# Patient Record
Sex: Female | Born: 1955 | Race: Black or African American | Hispanic: No | Marital: Married | Smoking: Former smoker
Health system: Southern US, Community
[De-identification: ages and names within clinical notes are randomized; demographics above are authoritative.]

## PROBLEM LIST (undated history)

## (undated) DIAGNOSIS — I639 Cerebral infarction, unspecified: Secondary | ICD-10-CM

## (undated) DIAGNOSIS — Z720 Tobacco use: Secondary | ICD-10-CM

## (undated) HISTORY — PX: APPENDECTOMY: SHX54

## (undated) HISTORY — PX: HYSTERECTOMY ABDOMINAL WITH SALPINGO-OOPHORECTOMY: SHX6792

---

## 2020-09-25 ENCOUNTER — Inpatient Hospital Stay (HOSPITAL_COMMUNITY)
Admission: EM | Admit: 2020-09-25 | Discharge: 2020-10-02 | DRG: 280 | Discharge status: Against Medical Advice | Attending: Cardiology | Admitting: Cardiology

## 2020-09-25 ENCOUNTER — Inpatient Hospital Stay (HOSPITAL_COMMUNITY)
Admission: EM | Discharge status: Against Medical Advice | Payer: Self-pay | Source: Home / Self Care | Attending: Cardiovascular Disease

## 2020-09-25 ENCOUNTER — Encounter (HOSPITAL_COMMUNITY): Payer: Self-pay | Admitting: Cardiology

## 2020-09-25 ENCOUNTER — Inpatient Hospital Stay (HOSPITAL_COMMUNITY)

## 2020-09-25 DIAGNOSIS — Z90710 Acquired absence of both cervix and uterus: Secondary | ICD-10-CM

## 2020-09-25 DIAGNOSIS — F32A Depression, unspecified: Secondary | ICD-10-CM | POA: Diagnosis present

## 2020-09-25 DIAGNOSIS — E669 Obesity, unspecified: Secondary | ICD-10-CM | POA: Diagnosis present

## 2020-09-25 DIAGNOSIS — I6330 Cerebral infarction due to thrombosis of unspecified cerebral artery: Secondary | ICD-10-CM | POA: Diagnosis not present

## 2020-09-25 DIAGNOSIS — I10 Essential (primary) hypertension: Secondary | ICD-10-CM | POA: Diagnosis present

## 2020-09-25 DIAGNOSIS — E1165 Type 2 diabetes mellitus with hyperglycemia: Secondary | ICD-10-CM | POA: Diagnosis not present

## 2020-09-25 DIAGNOSIS — Z8249 Family history of ischemic heart disease and other diseases of the circulatory system: Secondary | ICD-10-CM

## 2020-09-25 DIAGNOSIS — I6340 Cerebral infarction due to embolism of unspecified cerebral artery: Secondary | ICD-10-CM | POA: Diagnosis not present

## 2020-09-25 DIAGNOSIS — I25110 Atherosclerotic heart disease of native coronary artery with unstable angina pectoris: Secondary | ICD-10-CM | POA: Diagnosis present

## 2020-09-25 DIAGNOSIS — Z66 Do not resuscitate: Secondary | ICD-10-CM | POA: Diagnosis not present

## 2020-09-25 DIAGNOSIS — E0800 Diabetes mellitus due to underlying condition with hyperosmolarity without nonketotic hyperglycemic-hyperosmolar coma (NKHHC): Secondary | ICD-10-CM | POA: Diagnosis not present

## 2020-09-25 DIAGNOSIS — R0600 Dyspnea, unspecified: Secondary | ICD-10-CM | POA: Diagnosis present

## 2020-09-25 DIAGNOSIS — Z87891 Personal history of nicotine dependence: Secondary | ICD-10-CM

## 2020-09-25 DIAGNOSIS — Z7982 Long term (current) use of aspirin: Secondary | ICD-10-CM

## 2020-09-25 DIAGNOSIS — I213 ST elevation (STEMI) myocardial infarction of unspecified site: Secondary | ICD-10-CM | POA: Diagnosis present

## 2020-09-25 DIAGNOSIS — Z7189 Other specified counseling: Secondary | ICD-10-CM

## 2020-09-25 DIAGNOSIS — I2119 ST elevation (STEMI) myocardial infarction involving other coronary artery of inferior wall: Principal | ICD-10-CM | POA: Diagnosis present

## 2020-09-25 DIAGNOSIS — Z9114 Patient's other noncompliance with medication regimen: Secondary | ICD-10-CM

## 2020-09-25 DIAGNOSIS — R079 Chest pain, unspecified: Secondary | ICD-10-CM

## 2020-09-25 DIAGNOSIS — F419 Anxiety disorder, unspecified: Secondary | ICD-10-CM | POA: Diagnosis present

## 2020-09-25 DIAGNOSIS — I2510 Atherosclerotic heart disease of native coronary artery without angina pectoris: Secondary | ICD-10-CM | POA: Diagnosis not present

## 2020-09-25 DIAGNOSIS — Z20822 Contact with and (suspected) exposure to covid-19: Secondary | ICD-10-CM | POA: Diagnosis present

## 2020-09-25 DIAGNOSIS — I2111 ST elevation (STEMI) myocardial infarction involving right coronary artery: Secondary | ICD-10-CM | POA: Diagnosis not present

## 2020-09-25 DIAGNOSIS — E1100 Type 2 diabetes mellitus with hyperosmolarity without nonketotic hyperglycemic-hyperosmolar coma (NKHHC): Secondary | ICD-10-CM | POA: Diagnosis present

## 2020-09-25 DIAGNOSIS — E785 Hyperlipidemia, unspecified: Secondary | ICD-10-CM | POA: Diagnosis present

## 2020-09-25 DIAGNOSIS — E7800 Pure hypercholesterolemia, unspecified: Secondary | ICD-10-CM | POA: Diagnosis not present

## 2020-09-25 DIAGNOSIS — I255 Ischemic cardiomyopathy: Secondary | ICD-10-CM | POA: Diagnosis present

## 2020-09-25 DIAGNOSIS — R413 Other amnesia: Secondary | ICD-10-CM | POA: Diagnosis present

## 2020-09-25 DIAGNOSIS — Z79899 Other long term (current) drug therapy: Secondary | ICD-10-CM

## 2020-09-25 DIAGNOSIS — I5023 Acute on chronic systolic (congestive) heart failure: Secondary | ICD-10-CM | POA: Diagnosis not present

## 2020-09-25 DIAGNOSIS — I5022 Chronic systolic (congestive) heart failure: Secondary | ICD-10-CM | POA: Diagnosis not present

## 2020-09-25 DIAGNOSIS — F17210 Nicotine dependence, cigarettes, uncomplicated: Secondary | ICD-10-CM | POA: Diagnosis not present

## 2020-09-25 DIAGNOSIS — I361 Nonrheumatic tricuspid (valve) insufficiency: Secondary | ICD-10-CM | POA: Diagnosis not present

## 2020-09-25 DIAGNOSIS — E119 Type 2 diabetes mellitus without complications: Secondary | ICD-10-CM | POA: Diagnosis not present

## 2020-09-25 DIAGNOSIS — I639 Cerebral infarction, unspecified: Secondary | ICD-10-CM | POA: Diagnosis not present

## 2020-09-25 HISTORY — DX: Tobacco use: Z72.0

## 2020-09-25 HISTORY — DX: ST elevation (STEMI) myocardial infarction of unspecified site: I21.3

## 2020-09-25 HISTORY — PX: LEFT HEART CATH AND CORONARY ANGIOGRAPHY: CATH118249

## 2020-09-25 HISTORY — PX: CORONARY STENT INTERVENTION: CATH118234

## 2020-09-25 LAB — COMPREHENSIVE METABOLIC PANEL
ALT: 15 U/L (ref 0–44)
AST: 25 U/L (ref 15–41)
Albumin: 3.7 g/dL (ref 3.5–5.0)
Alkaline Phosphatase: 68 U/L (ref 38–126)
Anion gap: 13 (ref 5–15)
BUN: 9 mg/dL (ref 8–23)
CO2: 24 mmol/L (ref 22–32)
Calcium: 9.7 mg/dL (ref 8.9–10.3)
Chloride: 96 mmol/L — ABNORMAL LOW (ref 98–111)
Creatinine, Ser: 0.85 mg/dL (ref 0.44–1.00)
GFR, Estimated: >60 mL/min (ref >60)
Glucose, Bld: 477 mg/dL — ABNORMAL HIGH (ref 70–99)
Potassium: 3.3 mmol/L — ABNORMAL LOW (ref 3.5–5.1)
Sodium: 133 mmol/L — ABNORMAL LOW (ref 135–145)
Total Bilirubin: 0.7 mg/dL (ref 0.3–1.2)
Total Protein: 7.8 g/dL (ref 6.5–8.1)

## 2020-09-25 LAB — CBC WITH DIFFERENTIAL/PLATELET
Abs Immature Granulocytes: 0.02 10*3/uL (ref 0.00–0.07)
Basophils Absolute: 0 10*3/uL (ref 0.0–0.1)
Basophils Relative: 1 %
Eosinophils Absolute: 0.1 10*3/uL (ref 0.0–0.5)
Eosinophils Relative: 1 %
HCT: 42.2 % (ref 36.0–46.0)
Hemoglobin: 14 g/dL (ref 12.0–15.0)
Immature Granulocytes: 0 %
Lymphocytes Relative: 34 %
Lymphs Abs: 2.5 10*3/uL (ref 0.7–4.0)
MCH: 29.4 pg (ref 26.0–34.0)
MCHC: 33.2 g/dL (ref 30.0–36.0)
MCV: 88.7 fL (ref 80.0–100.0)
Monocytes Absolute: 0.6 10*3/uL (ref 0.1–1.0)
Monocytes Relative: 9 %
Neutro Abs: 4 10*3/uL (ref 1.7–7.7)
Neutrophils Relative %: 55 %
Platelets: 284 10*3/uL (ref 150–400)
RBC: 4.76 MIL/uL (ref 3.87–5.11)
RDW: 12.1 % (ref 11.5–15.5)
WBC: 7.2 10*3/uL (ref 4.0–10.5)
nRBC: 0 % (ref 0.0–0.2)

## 2020-09-25 LAB — TROPONIN I (HIGH SENSITIVITY)
Troponin I (High Sensitivity): 9554 ng/L (ref <18)
Troponin I (High Sensitivity): 11565 ng/L (ref <18)

## 2020-09-25 LAB — MAGNESIUM: Magnesium: 2.1 mg/dL (ref 1.7–2.4)

## 2020-09-25 LAB — HIV ANTIBODY (ROUTINE TESTING W REFLEX): HIV Screen 4th Generation wRfx: NONREACTIVE

## 2020-09-25 LAB — LIPID PANEL
Cholesterol: 215 mg/dL — ABNORMAL HIGH (ref 0–200)
HDL: 33 mg/dL — ABNORMAL LOW (ref >40)
LDL Cholesterol: 155 mg/dL — ABNORMAL HIGH (ref 0–99)
Total CHOL/HDL Ratio: 6.5 RATIO
Triglycerides: 133 mg/dL (ref <150)
VLDL: 27 mg/dL (ref 0–40)

## 2020-09-25 LAB — CBC
HCT: 41.7 % (ref 36.0–46.0)
Hemoglobin: 14 g/dL (ref 12.0–15.0)
MCH: 29.7 pg (ref 26.0–34.0)
MCHC: 33.6 g/dL (ref 30.0–36.0)
MCV: 88.5 fL (ref 80.0–100.0)
Platelets: 280 10*3/uL (ref 150–400)
RBC: 4.71 MIL/uL (ref 3.87–5.11)
RDW: 12.1 % (ref 11.5–15.5)
WBC: 7.2 10*3/uL (ref 4.0–10.5)
nRBC: 0 % (ref 0.0–0.2)

## 2020-09-25 LAB — PROTIME-INR
INR: 1.1 (ref 0.8–1.2)
Prothrombin Time: 14.1 seconds (ref 11.4–15.2)

## 2020-09-25 LAB — MRSA PCR SCREENING: MRSA by PCR: NEGATIVE

## 2020-09-25 LAB — POCT ACTIVATED CLOTTING TIME: Activated Clotting Time: 358 seconds

## 2020-09-25 LAB — RESP PANEL BY RT-PCR (FLU A&B, COVID) ARPGX2
Influenza A by PCR: NEGATIVE
Influenza B by PCR: NEGATIVE
SARS Coronavirus 2 by RT PCR: NEGATIVE

## 2020-09-25 LAB — T4, FREE: Free T4: 1.13 ng/dL — ABNORMAL HIGH (ref 0.61–1.12)

## 2020-09-25 SURGERY — LEFT HEART CATH AND CORONARY ANGIOGRAPHY
Anesthesia: LOCAL

## 2020-09-25 MED ORDER — METOPROLOL TARTRATE 25 MG PO TABS
25.0000 mg | ORAL_TABLET | Freq: Two times a day (BID) | ORAL | Status: DC
  Administered 2020-09-25: 25 mg via ORAL

## 2020-09-25 MED ORDER — VERAPAMIL HCL 2.5 MG/ML IV SOLN
INTRAVENOUS | Status: AC
  Filled 2020-09-25: qty 2

## 2020-09-25 MED ORDER — INSULIN ASPART 100 UNIT/ML IJ SOLN: 10.0000 [IU] | Freq: Once | INTRAMUSCULAR | Status: DC

## 2020-09-25 MED ORDER — MIDAZOLAM HCL 2 MG/2ML IJ SOLN
INTRAMUSCULAR | Status: AC
  Filled 2020-09-25: qty 2

## 2020-09-25 MED ORDER — MIDAZOLAM HCL 2 MG/2ML IJ SOLN
INTRAMUSCULAR | Status: DC | PRN
  Administered 2020-09-25: 2 mg via INTRAVENOUS

## 2020-09-25 MED ORDER — ONDANSETRON HCL 4 MG/2ML IJ SOLN: 4.0000 mg | Freq: Four times a day (QID) | INTRAMUSCULAR | Status: DC | PRN

## 2020-09-25 MED ORDER — HEPARIN (PORCINE) IN NACL 1000-0.9 UT/500ML-% IV SOLN
INTRAVENOUS | Status: DC | PRN
  Administered 2020-09-25 (×3): 500 mL

## 2020-09-25 MED ORDER — SODIUM CHLORIDE 0.9 % IV SOLN
250.0000 mL | INTRAVENOUS | Status: DC | PRN
Start: 2020-09-25 — End: 2020-10-02

## 2020-09-25 MED ORDER — TICAGRELOR 90 MG PO TABS
90.0000 mg | ORAL_TABLET | Freq: Two times a day (BID) | ORAL | Status: DC
  Administered 2020-09-26 – 2020-09-27 (×4): 90 mg via ORAL
  Filled 2020-09-25 (×4): qty 1

## 2020-09-25 MED ORDER — HEPARIN SODIUM (PORCINE) 1000 UNIT/ML IJ SOLN
INTRAMUSCULAR | Status: DC | PRN
  Administered 2020-09-25: 12000 [IU] via INTRAVENOUS

## 2020-09-25 MED ORDER — HYDRALAZINE HCL 20 MG/ML IJ SOLN: 10.0000 mg | INTRAMUSCULAR | Status: DC | PRN

## 2020-09-25 MED ORDER — FENTANYL CITRATE (PF) 100 MCG/2ML IJ SOLN
INTRAMUSCULAR | Status: AC
  Filled 2020-09-25: qty 2

## 2020-09-25 MED ORDER — ASPIRIN 81 MG PO CHEW
324.0000 mg | CHEWABLE_TABLET | ORAL | Status: AC
  Administered 2020-09-26: 324 mg via ORAL
  Filled 2020-09-25: qty 4

## 2020-09-25 MED ORDER — LABETALOL HCL 5 MG/ML IV SOLN: 10.0000 mg | INTRAVENOUS | Status: AC | PRN

## 2020-09-25 MED ORDER — INSULIN GLARGINE 100 UNIT/ML ~~LOC~~ SOLN
15.0000 [IU] | Freq: Every day | SUBCUTANEOUS | Status: DC
Start: 2020-09-25 — End: 2020-09-25
  Filled 2020-09-25: qty 0.15

## 2020-09-25 MED ORDER — ATORVASTATIN CALCIUM 80 MG PO TABS
80.0000 mg | ORAL_TABLET | Freq: Every day | ORAL | Status: DC
Start: 2020-09-25 — End: 2020-10-02
  Administered 2020-09-25 – 2020-10-02 (×8): 80 mg via ORAL
  Filled 2020-09-25 (×8): qty 1

## 2020-09-25 MED ORDER — MORPHINE SULFATE (PF) 2 MG/ML IV SOLN
2.0000 mg | INTRAVENOUS | Status: DC | PRN
  Administered 2020-09-26 – 2020-09-30 (×20): 2 mg via INTRAVENOUS
  Filled 2020-09-25 (×21): qty 1

## 2020-09-25 MED ORDER — ACETAMINOPHEN 325 MG PO TABS
650.0000 mg | ORAL_TABLET | ORAL | Status: DC | PRN
  Administered 2020-09-25 – 2020-09-26 (×2): 650 mg via ORAL
  Filled 2020-09-25 (×2): qty 2

## 2020-09-25 MED ORDER — INSULIN ASPART 100 UNIT/ML IJ SOLN: 0.0000 [IU] | Freq: Every day | INTRAMUSCULAR | Status: DC

## 2020-09-25 MED ORDER — HEPARIN SODIUM (PORCINE) 1000 UNIT/ML IJ SOLN
INTRAMUSCULAR | Status: AC
  Filled 2020-09-25: qty 2

## 2020-09-25 MED ORDER — ALPRAZOLAM 0.25 MG PO TABS: 0.2500 mg | ORAL_TABLET | Freq: Two times a day (BID) | ORAL | Status: DC | PRN

## 2020-09-25 MED ORDER — OXYCODONE HCL 5 MG PO TABS
5.0000 mg | ORAL_TABLET | ORAL | Status: DC | PRN
  Administered 2020-09-29 – 2020-10-01 (×3): 10 mg via ORAL
  Filled 2020-09-25 (×3): qty 2

## 2020-09-25 MED ORDER — POTASSIUM CHLORIDE CRYS ER 20 MEQ PO TBCR
40.0000 meq | EXTENDED_RELEASE_TABLET | Freq: Two times a day (BID) | ORAL | Status: DC
  Administered 2020-09-25: 40 meq via ORAL

## 2020-09-25 MED ORDER — TIROFIBAN HCL IN NACL 5-0.9 MG/100ML-% IV SOLN
INTRAVENOUS | Status: DC | PRN
  Administered 2020-09-25: 0.15 ug/kg/min via INTRAVENOUS

## 2020-09-25 MED ORDER — SODIUM CHLORIDE 0.9 % IV SOLN: INTRAVENOUS | Status: AC

## 2020-09-25 MED ORDER — TIROFIBAN (AGGRASTAT) BOLUS VIA INFUSION
INTRAVENOUS | Status: DC | PRN
  Administered 2020-09-25: 2552.5 ug via INTRAVENOUS

## 2020-09-25 MED ORDER — HEPARIN (PORCINE) IN NACL 1000-0.9 UT/500ML-% IV SOLN
INTRAVENOUS | Status: AC
  Filled 2020-09-25: qty 500

## 2020-09-25 MED ORDER — SODIUM CHLORIDE 0.9% FLUSH
3.0000 mL | Freq: Two times a day (BID) | INTRAVENOUS | Status: DC
  Administered 2020-09-25 – 2020-10-02 (×14): 3 mL via INTRAVENOUS

## 2020-09-25 MED ORDER — LIDOCAINE HCL (PF) 1 % IJ SOLN
INTRAMUSCULAR | Status: AC
  Filled 2020-09-25: qty 30

## 2020-09-25 MED ORDER — SODIUM CHLORIDE 0.9% FLUSH: 3.0000 mL | INTRAVENOUS | Status: DC | PRN

## 2020-09-25 MED ORDER — POTASSIUM CHLORIDE CRYS ER 20 MEQ PO TBCR
40.0000 meq | EXTENDED_RELEASE_TABLET | Freq: Once | ORAL | Status: DC
  Filled 2020-09-25: qty 2

## 2020-09-25 MED ORDER — TICAGRELOR 90 MG PO TABS
ORAL_TABLET | ORAL | Status: AC
  Filled 2020-09-25: qty 2

## 2020-09-25 MED ORDER — ACETAMINOPHEN 325 MG PO TABS: 650.0000 mg | ORAL_TABLET | ORAL | Status: DC | PRN

## 2020-09-25 MED ORDER — SODIUM CHLORIDE 0.9 % IV SOLN: INTRAVENOUS | Status: DC

## 2020-09-25 MED ORDER — ATORVASTATIN CALCIUM 80 MG PO TABS: 80.0000 mg | ORAL_TABLET | Freq: Every day | ORAL | Status: DC

## 2020-09-25 MED ORDER — VERAPAMIL HCL 2.5 MG/ML IV SOLN
INTRAVENOUS | Status: DC | PRN
  Administered 2020-09-25: 10 mL via INTRA_ARTERIAL

## 2020-09-25 MED ORDER — INSULIN ASPART 100 UNIT/ML IJ SOLN: 0.0000 [IU] | Freq: Three times a day (TID) | INTRAMUSCULAR | Status: DC

## 2020-09-25 MED ORDER — INSULIN ASPART 100 UNIT/ML IJ SOLN: 8.0000 [IU] | Freq: Once | INTRAMUSCULAR | Status: DC

## 2020-09-25 MED ORDER — NITROGLYCERIN IN D5W 200-5 MCG/ML-% IV SOLN
0.0000 ug/min | INTRAVENOUS | Status: DC
  Administered 2020-09-25: 5 ug/min via INTRAVENOUS
  Filled 2020-09-25: qty 250

## 2020-09-25 MED ORDER — NITROGLYCERIN 0.4 MG SL SUBL
0.4000 mg | SUBLINGUAL_TABLET | SUBLINGUAL | Status: DC | PRN
  Administered 2020-09-28 (×2): 0.4 mg via SUBLINGUAL
  Filled 2020-09-25: qty 1

## 2020-09-25 MED ORDER — FENTANYL CITRATE (PF) 100 MCG/2ML IJ SOLN
INTRAMUSCULAR | Status: DC | PRN
  Administered 2020-09-25: 50 ug via INTRAVENOUS

## 2020-09-25 MED ORDER — ASPIRIN EC 81 MG PO TBEC
81.0000 mg | DELAYED_RELEASE_TABLET | Freq: Every day | ORAL | Status: DC
  Administered 2020-09-27 – 2020-10-02 (×6): 81 mg via ORAL
  Filled 2020-09-25 (×7): qty 1

## 2020-09-25 MED ORDER — SODIUM CHLORIDE 0.9 % IV SOLN
INTRAVENOUS | Status: AC | PRN
  Administered 2020-09-25: 50 mL/h via INTRAVENOUS

## 2020-09-25 MED ORDER — NITROGLYCERIN 1 MG/10 ML FOR IR/CATH LAB
INTRA_ARTERIAL | Status: AC
  Filled 2020-09-25: qty 10

## 2020-09-25 MED ORDER — ASPIRIN 300 MG RE SUPP: 300.0000 mg | RECTAL | Status: AC

## 2020-09-25 MED ORDER — LIDOCAINE HCL (PF) 1 % IJ SOLN
INTRAMUSCULAR | Status: DC | PRN
  Administered 2020-09-25: 2 mL

## 2020-09-25 MED ORDER — TICAGRELOR 90 MG PO TABS
ORAL_TABLET | ORAL | Status: DC | PRN
  Administered 2020-09-25: 180 mg via ORAL

## 2020-09-25 MED ORDER — METOPROLOL TARTRATE 25 MG PO TABS
25.0000 mg | ORAL_TABLET | Freq: Two times a day (BID) | ORAL | Status: DC
  Administered 2020-09-26 (×2): 25 mg via ORAL
  Filled 2020-09-25 (×3): qty 1

## 2020-09-25 MED ORDER — TIROFIBAN HCL IN NACL 5-0.9 MG/100ML-% IV SOLN
INTRAVENOUS | Status: AC
  Filled 2020-09-25: qty 100

## 2020-09-25 MED ORDER — ASPIRIN 81 MG PO CHEW: 81.0000 mg | CHEWABLE_TABLET | Freq: Every day | ORAL | Status: DC

## 2020-09-25 MED ORDER — INSULIN ASPART 100 UNIT/ML IJ SOLN: 6.0000 [IU] | Freq: Three times a day (TID) | INTRAMUSCULAR | Status: DC

## 2020-09-25 SURGICAL SUPPLY — 14 items
CATH 5FR JL3.5 JR4 ANG PIG MP (CATHETERS) ×2 IMPLANT
CATH VISTA GUIDE 6FR JR4 (CATHETERS) ×2 IMPLANT
DEVICE RAD COMP TR BAND LRG (VASCULAR PRODUCTS) ×2 IMPLANT
GLIDESHEATH SLEND SS 6F .021 (SHEATH) ×2 IMPLANT
GUIDEWIRE INQWIRE 1.5J.035X260 (WIRE) ×1 IMPLANT
INQWIRE 1.5J .035X260CM (WIRE) ×2
KIT ENCORE 26 ADVANTAGE (KITS) ×2 IMPLANT
KIT HEART LEFT (KITS) ×2 IMPLANT
PACK CARDIAC CATHETERIZATION (CUSTOM PROCEDURE TRAY) ×2 IMPLANT
TRANSDUCER W/STOPCOCK (MISCELLANEOUS) ×2 IMPLANT
TUBING CIL FLEX 10 FLL-RA (TUBING) ×2 IMPLANT
WIRE ASAHI FIELDER XT 190CM (WIRE) ×2 IMPLANT
WIRE COUGAR XT STRL 190CM (WIRE) ×2 IMPLANT
WIRE HI TORQ WHISPER MS 190CM (WIRE) ×2 IMPLANT

## 2020-09-25 NOTE — H&P (Addendum)
Cardiology Admission History and Physical:   Patient ID: Sabrina Ellis MRN: [id number]; DOB: [birth year]   Admission date: 09/25/2020  PCP:  No primary care provider on file.   [facility] Providers Cardiologist:  None        Chief Complaint:  chest pain  Patient Profile:   Sabrina Ellis is a 65 y.o. female with no PMH though does not follow with MD who is being seen 09/25/2020 for the evaluation of STEMI.  History of Present Illness:   Ms. Ellis has had 2-3 months of episodic chest pain and told someone today because it seemed worse.  The friend called EMS and pt was being taken to [facility] hosp when ST became elevated in II, III, AVF.  [facility] was brought [facility] cardiac cath.  Remote tobacco hx stopped 5 years ago.      She does not follow with MD. No meds.  By EMS rec'd 324 mg of ASA and IV morphine for chest pain.  Currently here 4/10 pain.      Past Medical History:  Diagnosis Date   STEMI (ST elevation myocardial infarction) (HCC) 09/25/2020   Tobacco abuse stopped 5 yrs ago        Medications Prior to Admission: Prior to Admission medications   Not on File   none  Allergies:   No Known Allergies  Social History:   Social History   Socioeconomic History   Marital status: Not on file    Spouse name: Not on file   Number of children: Not on file   Years of education: Not on file   Highest education level: Not on file  Occupational History   Not on file  Tobacco Use   Smoking status: Former    Years: 45.00    Pack years: 0.00    Types: Cigarettes    Quit date: 04/2015    Years since quitting: 5.4   Smokeless tobacco: Never  Substance and Sexual Activity   Alcohol use: Not Currently   Drug use: Not Currently   Sexual activity: Yes  Other Topics Concern   Not on file  Social History Narrative   Not on file   Social Determinants of Health   Financial Resource Strain: Not on file  Food Insecurity: Not on file   Transportation Needs: Not on file  Physical Activity: Not on file  Stress: Not on file  Social Connections: Not on file  Intimate Partner Violence: Not on file    Family History:   The patient's family history includes Hypertension in her mother.    ROS:  Please see the history of present illness.  General:no colds or fevers, no weight changes Skin:no rashes or ulcers HEENT:no blurred vision, no congestion CV:see HPI PUL:see HPI GI:no diarrhea constipation or melena, no indigestion GU:no hematuria, no dysuria MS:no joint pain, no claudication Neuro:no syncope, no lightheadedness Endo:no diabetes, no thyroid disease  All other ROS reviewed and negative.     Physical Exam/Data:   Vitals:   09/25/20 1649  SpO2: 98%  Weight: 102.1 kg  Height: 5\' 8"  (1.727 m)   No intake or output data in the 24 hours ending 09/25/20 1707 Last 3 Weights 09/25/2020  Weight (lbs) 225 lb  Weight (kg) 102.059 kg     Body mass index is 34.21 kg/m.  General:  Well nourished, well developed, in no acute distress but very anxious HEENT: normal Lymph: no adenopathy Neck: no JVD Endocrine:  No thryomegaly Vascular: No carotid bruits;  pedal pulses 2+ bilaterally   Cardiac:  normal S1, S2; RRR; no murmur  Lungs:  clear to auscultation bilaterally, no wheezing, rhonchi or rales  Abd: soft, nontender, no hepatomegaly  Ext: no edema Musculoskeletal:  No deformities, BUE and BLE strength normal and equal Skin: warm and dry  Neuro:  alert and oriented X 3 MAE follows commands, no focal abnormalities noted Psych:  Normal affect    EKG:  The ECG that was done today by EMS SR with ST elevation inf leads was personally reviewed   Relevant CV Studies: none  Laboratory Data:  High Sensitivity Troponin:  No results for input(s): TROPONINIHS in the last 720 hours.    ChemistryNo results for input(s): NA, K, CL, CO2, GLUCOSE, BUN, CREATININE, CALCIUM, GFRNONAA, GFRAA, ANIONGAP in the last 168 hours.   No results for input(s): PROT, ALBUMIN, AST, ALT, ALKPHOS, BILITOT in the last 168 hours. HematologyNo results for input(s): WBC, RBC, HGB, HCT, MCV, MCH, MCHC, RDW, PLT in the last 168 hours. BNPNo results for input(s): BNP, PROBNP in the last 168 hours.  DDimer No results for input(s): DDIMER in the last 168 hours.   Radiology/Studies:  No results found.   Assessment and Plan:   STEMI inf wall, emergent cath now.  Will follow BP and check A1C, lipids.  Add BB and statin along with ASA.     Risk Assessment/Risk Scores:    TIMI Risk Score for Unstable Angina or Non-ST Elevation MI:  STEMI  The patient's TIMI risk score is  , which indicates a  % risk of all cause mortality, new or recurrent myocardial infarction or need for urgent revascularization in the next 14 days.       Severity of Illness: The appropriate patient status for this patient is INPATIENT. Inpatient status is judged to be reasonable and necessary in order to provide the required intensity of service to ensure the patient's safety. The patient's presenting symptoms, physical exam findings, and initial radiographic and laboratory data in the context of their chronic comorbidities is felt to place them at high risk for further clinical deterioration. Furthermore, it is not anticipated that the patient will be medically stable for discharge from the hospital within 2 midnights of admission. The following factors support the patient status of inpatient.   " The patient's presenting symptoms include chest pain. " The worrisome physical exam findings include none. " The initial radiographic and laboratory data are worrisome because of no labs EKG with STEMI. " The chronic co-morbidities include obesity.   * I certify that at the point of admission it is my clinical judgment that the patient will require inpatient hospital care spanning beyond 2 midnights from the point of admission due to high intensity of service, high  risk for further deterioration and high frequency of surveillance required.*   For questions or updates, please contact [facility] Please consult [email or web address] for contact info under     Signed, Nada Boozer, NP  09/25/2020 5:07 PM   I have personally seen and examined this patient. I agree with the assessment and plan as outlined above.  65 yo female with no known PMH who is presenting with an inferior MI. Q waves on EKG suggest that her event occurred greater than 12 hours ago. Mild chest pain currently. Plan emergent cardiac cath. Further plans to follow after cath.   Verne Carrow 09/25/2020 5:51 PM

## 2020-09-25 NOTE — Significant Event (Signed)
Briefly, Ms. Kendrick-Comer presented today with a late-presenting inferior MI after her case worker called EMS due to complaints of dyspnea. She was found to have 3 vessel CAD, with culprit RCA occlusion, but unable to cross, likely due to late presentation. She was initiated on medical therapy with plans for CABG evaluation.  Her lab work recently resulted with a blood glucose of 477. She clarified that she was told at one point she had diabetes but was not on medical therapy. I ordered insulin therapy for her uncontrolled diabetes. I was then notified by her RN that she refused to take any insulin products. I proceeded to clarify this with her and had an extensive conversation regarding her reasoning for refusing insulin, the diagnosis of diabetes, the implications and complications of untreated diabetes, that refusing to treat her diabetes would likely not allow her to be candidate for CABG, that her diabetes was her primary risk factor for multivessel CAD, the well-established treatments for diabetes, and her understanding of all of the above. These apparently are long held and VERY strong beliefs, based in part on family members who died while taking insulin and some sort of personal religious belief that she should not take certain medications. Despite extensive conversation and exploration of her reasoning, she was very steadfast in her refusal of insulin or certain blood pressure medications that she cannot recall the name of (okay with metoprolol). Although some of her statements were contradictory and logical reasoning was lacking at times, she was able to demonstrate complete orientation and competence at making these decisions and was able to even admit that this was a personal belief that I would likely not understand and not in line with medical standard of care. She was able to express understanding of my medical recommendations and what I stated as the implications for refusing treatment, although  she expressed her own skepticism. She did believe we had her best interest in mind with our medical recommendations, but again stated she did not want any form of treatment for her diabetes. She also did not want any blood glucose checks. She has been trying to complete a DNR in the outpatient setting, and would like her code status changed with a portable DNR at discharge.   Will discontinue all insulin orders and blood glucose checks Code status updated to DNR

## 2020-09-26 ENCOUNTER — Inpatient Hospital Stay (HOSPITAL_COMMUNITY)

## 2020-09-26 ENCOUNTER — Other Ambulatory Visit: Payer: Self-pay

## 2020-09-26 DIAGNOSIS — E1165 Type 2 diabetes mellitus with hyperglycemia: Secondary | ICD-10-CM

## 2020-09-26 DIAGNOSIS — R079 Chest pain, unspecified: Secondary | ICD-10-CM

## 2020-09-26 DIAGNOSIS — I2119 ST elevation (STEMI) myocardial infarction involving other coronary artery of inferior wall: Principal | ICD-10-CM

## 2020-09-26 DIAGNOSIS — I361 Nonrheumatic tricuspid (valve) insufficiency: Secondary | ICD-10-CM

## 2020-09-26 LAB — CBC
HCT: 37.2 % (ref 36.0–46.0)
Hemoglobin: 12.4 g/dL (ref 12.0–15.0)
MCH: 29.7 pg (ref 26.0–34.0)
MCHC: 33.3 g/dL (ref 30.0–36.0)
MCV: 89 fL (ref 80.0–100.0)
Platelets: 267 10*3/uL (ref 150–400)
RBC: 4.18 MIL/uL (ref 3.87–5.11)
RDW: 12.4 % (ref 11.5–15.5)
WBC: 7.2 10*3/uL (ref 4.0–10.5)
nRBC: 0 % (ref 0.0–0.2)

## 2020-09-26 LAB — LIPID PANEL
Cholesterol: 186 mg/dL (ref 0–200)
HDL: 24 mg/dL — ABNORMAL LOW (ref >40)
LDL Cholesterol: 120 mg/dL — ABNORMAL HIGH (ref 0–99)
Total CHOL/HDL Ratio: 7.8 RATIO
Triglycerides: 209 mg/dL — ABNORMAL HIGH (ref <150)
VLDL: 42 mg/dL — ABNORMAL HIGH (ref 0–40)

## 2020-09-26 LAB — ECHOCARDIOGRAM COMPLETE
AR max vel: 2.85 cm2
AV Area VTI: 2.96 cm2
AV Area mean vel: 3.27 cm2
AV Mean grad: 3 mmHg
AV Peak grad: 5.5 mmHg
Ao pk vel: 1.17 m/s
Area-P 1/2: 5.23 cm2
Height: 68 in
S' Lateral: 3.1 cm
Weight: 3139.35 oz

## 2020-09-26 LAB — BASIC METABOLIC PANEL
Anion gap: 9 (ref 5–15)
BUN: 7 mg/dL — ABNORMAL LOW (ref 8–23)
CO2: 25 mmol/L (ref 22–32)
Calcium: 9 mg/dL (ref 8.9–10.3)
Chloride: 97 mmol/L — ABNORMAL LOW (ref 98–111)
Creatinine, Ser: 0.73 mg/dL (ref 0.44–1.00)
GFR, Estimated: >60 mL/min (ref >60)
Glucose, Bld: 433 mg/dL — ABNORMAL HIGH (ref 70–99)
Potassium: 4.1 mmol/L (ref 3.5–5.1)
Sodium: 131 mmol/L — ABNORMAL LOW (ref 135–145)

## 2020-09-26 LAB — POCT I-STAT, CHEM 8
BUN: 10 mg/dL (ref 8–23)
Calcium, Ion: 1.28 mmol/L (ref 1.15–1.40)
Chloride: 95 mmol/L — ABNORMAL LOW (ref 98–111)
Creatinine, Ser: 0.7 mg/dL (ref 0.44–1.00)
Glucose, Bld: 562 mg/dL (ref 70–99)
HCT: 43 % (ref 36.0–46.0)
Hemoglobin: 14.6 g/dL (ref 12.0–15.0)
Potassium: 3.8 mmol/L (ref 3.5–5.1)
Sodium: 133 mmol/L — ABNORMAL LOW (ref 135–145)
TCO2: 25 mmol/L (ref 22–32)

## 2020-09-26 LAB — TSH: TSH: 1.404 u[IU]/mL (ref 0.350–4.500)

## 2020-09-26 LAB — TROPONIN I (HIGH SENSITIVITY): Troponin I (High Sensitivity): 11491 ng/L (ref <18)

## 2020-09-26 MED ORDER — PERFLUTREN LIPID MICROSPHERE
1.0000 mL | INTRAVENOUS | Status: AC | PRN
  Administered 2020-09-26: 2 mL via INTRAVENOUS
  Filled 2020-09-26: qty 10

## 2020-09-26 MED ORDER — CHLORHEXIDINE GLUCONATE CLOTH 2 % EX PADS
6.0000 | MEDICATED_PAD | Freq: Every day | CUTANEOUS | Status: DC
  Administered 2020-09-26 – 2020-10-02 (×6): 6 via TOPICAL

## 2020-09-26 NOTE — Progress Notes (Signed)
Progress Note   Subjective   The patient has been quite difficult for staff.  She has refused multiple interventions.  Complains of minor chest discomfort.  Denies SOB.  Mostly wants to go home.  She states "I did not ask to be brought here.  I am not afraid of dying".  Inpatient Medications    Scheduled Meds:  aspirin  324 mg Oral NOW   Or   aspirin  300 mg Rectal NOW   aspirin EC  81 mg Oral Daily   atorvastatin  80 mg Oral Daily   Chlorhexidine Gluconate Cloth  6 each Topical Daily   metoprolol tartrate  25 mg Oral BID   potassium chloride  40 mEq Oral Once   sodium chloride flush  3 mL Intravenous Q12H   ticagrelor  90 mg Oral BID   Continuous Infusions:  sodium chloride     nitroGLYCERIN 40 mcg/min (09/26/20 0800)   PRN Meds: sodium chloride, acetaminophen, ALPRAZolam, morphine injection, nitroGLYCERIN, ondansetron (ZOFRAN) IV, oxyCODONE, sodium chloride flush   Vital Signs    Vitals:   09/26/20 0645 09/26/20 0700 09/26/20 0741 09/26/20 0835  BP: 123/69 (!) 118/96  128/85  Pulse: 89 89  (!) 101  Resp: 18 19  (!) 22  Temp:   98.3 F (36.8 C)   TempSrc:   Axillary   SpO2: 99% 98%  98%  Weight:      Height:        Intake/Output Summary (Last 24 hours) at 09/26/2020 0840 Last data filed at 09/26/2020 0800 Gross per 24 hour  Intake 1545.16 ml  Output 675 ml  Net 870.16 ml   Filed Weights   09/25/20 1649 09/26/20 0500  Weight: 102.1 kg 89 kg    Telemetry    sinus - Personally Reviewed  Physical Exam   GEN- The patient is well appearing, alert and oriented x 3 today.   Head- normocephalic, atraumatic Eyes-  Sclera clear, conjunctiva pink Ears- hearing intact Oropharynx- clear Neck- supple, Lungs-  normal work of breathing Heart- Regular rate and rhythm  GI- soft  Extremities- no clubbing, cyanosis, or edema  MS- no significant deformity or atrophy Skin- no rash or lesion Psych- tangential and requires frequent redirection, focuses on religious  ideology,  states "I am an Israelite" and declines most interventions.  Very difficult to redirect or to have productive conversation Neuro- strength and sensation are intact   Labs    Chemistry Recent Labs  Lab 09/25/20 1709 09/26/20 0243  NA 133* 131*  K 3.3* 4.1  CL 96* 97*  CO2 24 25  GLUCOSE 477* 433*  BUN 9 7*  CREATININE 0.85 0.73  CALCIUM 9.7 9.0  PROT 7.8  --   ALBUMIN 3.7  --   AST 25  --   ALT 15  --   ALKPHOS 68  --   BILITOT 0.7  --   GFRNONAA >60 >60  ANIONGAP 13 9     Hematology Recent Labs  Lab 09/25/20 1709 09/26/20 0243  WBC 7.2  7.2 7.2  RBC 4.76  4.71 4.18  HGB 14.0  14.0 12.4  HCT 42.2  41.7 37.2  MCV 88.7  88.5 89.0  MCH 29.4  29.7 29.7  MCHC 33.2  33.6 33.3  RDW 12.1  12.1 12.4  PLT 284  280 267     Patient ID  Sabrina Ellis is a 65 y.o. female with no PMH though does not follow with MD admitted with STEMI.  Assessment & Plan    1.  CAD Late presenting inferior STEMI secondary to occlusion of large, dominant RCA proximally.  PCI was unsuccessful.  There are L to R collaterals.  She has moderate severe proximal LAD stenosis also. We have spoken at length today.  The patient has refused multiple treatments.  I worry that our options are very limited.  I have advised that she is at risk for stroke, MI, arrhythmia, and death.  She states "I am ready to die.  You do not decide when I die, that is for God to decide."   I have asked if she has any family or support that could help assist her in medical decisions.  She does not.   I have spoken at length with Dr Dorris Fetch and we agree that she is at high risk for surgery given her refusal to participate in her care currently.  He is willing to see her if she decides to proceed with cooperation into her care. For now, we will continue current medical therapy and have ongoing supportive conversations.  2. Diabetes Newly diagnosed I have reviewed Dr Okey Dupre' note from last night  detailing her refusal to take insulin. The patient and I discussed risks of blindness, renal failure, stroke, MI, and PVD with possible amputation from diabetes.  She is clear that she is not ready to make decisions about active management of this disease.   I agree with Dr Serita Kyle assessment from 09/25/2020 "Although some of her statements were contradictory and logical reasoning was lacking at times, she was able to demonstrate complete orientation and competence at making these decisions and was able to even admit that this was a personal belief that I would likely not understand and not in line with medical standard of care. She was able to express understanding of my medical recommendations and what I stated as the implications for refusing treatment, although she expressed her own skepticism. She did believe we had her best interest in mind with our medical recommendations, but again stated she did not want any form of treatment for her diabetes."  The patient has expressed wishes to leave AMA.  We will continue to have conversations, but will ultimately honor her decision.  Hillis Range MD, [facility] 09/26/2020 8:40 AM

## 2020-09-26 NOTE — Progress Notes (Signed)
Dr. Okey Dupre at bedside to evaluate TR band site.  Order to hold ASA until after TR band is off due to bleeding and complications.

## 2020-09-26 NOTE — Progress Notes (Addendum)
Patient states she will refuse blood administration for any reason and states that 'we can't help her' and she wishes to go home. Patient refused blood sugar checks and will not receive insulin because she states it 'killed one of her friends.'  Patient states the only person we may give information to is her friend [place].

## 2020-09-26 NOTE — Progress Notes (Addendum)
Discusses new medication orders with patient at this time.  Pt refuses to take ordered insulin. Patient states her refusal is due to her strong feelings that "My lord doesn't want me to take insulin."  Pt states that "both my sister and my father died after being treated with insulin."  Discussed risks/benefits of related DM treatment with cardiac health with patient.  Updated on call cardiology fellow of patient's refusal and wishes.

## 2020-09-27 MED ORDER — NITROGLYCERIN 2 % TD OINT
0.5000 [in_us] | TOPICAL_OINTMENT | Freq: Four times a day (QID) | TRANSDERMAL | Status: DC
  Administered 2020-09-27 – 2020-09-28 (×3): 0.5 [in_us] via TOPICAL
  Filled 2020-09-27: qty 30

## 2020-09-27 MED ORDER — METOPROLOL TARTRATE 50 MG PO TABS
50.0000 mg | ORAL_TABLET | Freq: Two times a day (BID) | ORAL | Status: DC
Start: 2020-09-27 — End: 2020-09-28
  Administered 2020-09-27 (×2): 50 mg via ORAL
  Filled 2020-09-27 (×2): qty 1

## 2020-09-27 MED ORDER — AMLODIPINE BESYLATE 5 MG PO TABS
5.0000 mg | ORAL_TABLET | Freq: Every day | ORAL | Status: DC
  Administered 2020-09-27: 5 mg via ORAL
  Filled 2020-09-27: qty 1

## 2020-09-27 NOTE — Progress Notes (Signed)
Progress Note   Subjective   Doing well today, the patient denies SOB.  + intermittent chest pain overnight  Inpatient Medications    Scheduled Meds:  aspirin EC  81 mg Oral Daily   atorvastatin  80 mg Oral Daily   Chlorhexidine Gluconate Cloth  6 each Topical Daily   metoprolol tartrate  25 mg Oral BID   potassium chloride  40 mEq Oral Once   sodium chloride flush  3 mL Intravenous Q12H   ticagrelor  90 mg Oral BID   Continuous Infusions:  sodium chloride     nitroGLYCERIN Stopped (09/26/20 1825)   PRN Meds: sodium chloride, acetaminophen, ALPRAZolam, morphine injection, nitroGLYCERIN, ondansetron (ZOFRAN) IV, oxyCODONE, sodium chloride flush   Vital Signs    Vitals:   09/27/20 0700 09/27/20 0736 09/27/20 0753 09/27/20 0800  BP: (!) 144/88   135/78  Pulse: 92  94 90  Resp: 19  (!) 21 (!) 25  Temp:  97.6 F (36.4 C)    TempSrc:  Axillary    SpO2: 92%  99% 93%  Weight:      Height:        Intake/Output Summary (Last 24 hours) at 09/27/2020 0830 Last data filed at 09/27/2020 0800 Gross per 24 hour  Intake 347.14 ml  Output 1300 ml  Net -952.86 ml   Filed Weights   09/25/20 1649 09/26/20 0500  Weight: 102.1 kg 89 kg    Telemetry    sinus - Personally Reviewed  Physical Exam   GEN- The patient is well appearing, sleeping but rouses Head- normocephalic, atraumatic Eyes-  Sclera clear, conjunctiva pink Ears- hearing intact Oropharynx- clear Neck- supple, Lungs-  normal work of breathing Heart- Regular rate and rhythm  GI- soft  Extremities- no clubbing, cyanosis, or edema  MS- no significant deformity or atrophy   Labs    Chemistry Recent Labs  Lab 09/25/20 1702 09/25/20 1709 09/26/20 0243  NA 133* 133* 131*  K 3.8 3.3* 4.1  CL 95* 96* 97*  CO2  --  24 25  GLUCOSE 562* 477* 433*  BUN 10 9 7*  CREATININE 0.70 0.85 0.73  CALCIUM  --  9.7 9.0  PROT  --  7.8  --   ALBUMIN  --  3.7  --   AST  --  25  --   ALT  --  15  --   ALKPHOS  --   68  --   BILITOT  --  0.7  --   GFRNONAA  --  >60 >60  ANIONGAP  --  13 9     Hematology Recent Labs  Lab 09/25/20 1702 09/25/20 1709 09/26/20 0243  WBC  --  7.2  7.2 7.2  RBC  --  4.76  4.71 4.18  HGB 14.6 14.0  14.0 12.4  HCT 43.0 42.2  41.7 37.2  MCV  --  88.7  88.5 89.0  MCH  --  29.4  29.7 29.7  MCHC  --  33.2  33.6 33.3  RDW  --  12.1  12.1 12.4  PLT  --  284  280 267     Patient ID  Sabrina Ellis is a 65 y.o. female with no PMH though does not follow with MD admitted with STEMI.  Assessment & Plan    1.  CAD Late presenting STEMI Still having some chest pain with weaning IV nitro Will add nitropaste Increase metoprolol and add amlodipine for anginal relief Continue ASA and ticagrelor for  now Continue high dose statin  She remains skeptical of medical care.  Clear that she will not take blood products.  I have spoken with Dr Dorris Fetch.  She would be high risk for surgery.  We will try optimization of medicines today.  If she continues to have angina, she may be more willing to consider surgical evaluation.  2. Diabetes New diagnosis Continues to refuse insulin as per Dr Serita Kyle note  Very challenging situation.  A high level of decision making was required for this visit.  Hopefully, if her insight improves, she will be more willing to comply with medical recommendations.   Hillis Range MD, [facility] 09/27/2020 8:30 AM

## 2020-09-28 ENCOUNTER — Encounter (HOSPITAL_COMMUNITY): Payer: Self-pay | Admitting: Cardiovascular Disease

## 2020-09-28 LAB — HEMOGLOBIN A1C
Hgb A1c MFr Bld: 14.2 % — ABNORMAL HIGH (ref 4.8–5.6)
Mean Plasma Glucose: 361 mg/dL

## 2020-09-28 MED ORDER — METOPROLOL TARTRATE 12.5 MG HALF TABLET
12.5000 mg | ORAL_TABLET | Freq: Two times a day (BID) | ORAL | Status: DC
  Administered 2020-09-28 – 2020-09-29 (×3): 12.5 mg via ORAL
  Filled 2020-09-28 (×3): qty 1

## 2020-09-28 MED ORDER — COLCHICINE 0.6 MG PO TABS
0.6000 mg | ORAL_TABLET | Freq: Two times a day (BID) | ORAL | Status: DC
  Administered 2020-09-28 – 2020-10-02 (×8): 0.6 mg via ORAL
  Filled 2020-09-28 (×8): qty 1

## 2020-09-28 MED ORDER — CLOPIDOGREL BISULFATE 75 MG PO TABS
75.0000 mg | ORAL_TABLET | Freq: Every day | ORAL | Status: DC
  Administered 2020-09-28 – 2020-09-29 (×2): 75 mg via ORAL
  Filled 2020-09-28 (×2): qty 1

## 2020-09-28 MED ORDER — ISOSORBIDE MONONITRATE ER 30 MG PO TB24
30.0000 mg | ORAL_TABLET | Freq: Every day | ORAL | Status: DC
  Administered 2020-09-28 – 2020-10-02 (×5): 30 mg via ORAL
  Filled 2020-09-28 (×5): qty 1

## 2020-09-28 MED FILL — Lidocaine HCl Local Preservative Free (PF) Inj 1%: INTRAMUSCULAR | Qty: 30 | Status: AC

## 2020-09-28 MED FILL — Nitroglycerin IV Soln 100 MCG/ML in D5W: INTRA_ARTERIAL | Qty: 10 | Status: AC

## 2020-09-28 NOTE — Progress Notes (Signed)
Pt is complaining of burning and crushing chest pain. Also complaining of right shoulder pain, headache and nausea.  Initial chest pain level 6-7. After giving morphine and nitro x 3, chest pain level came down to 5. Notified Cardiology. EKG ordered.

## 2020-09-28 NOTE — Progress Notes (Signed)
Progress Note  Patient Name: Sabrina Ellis Date of Encounter: 09/28/2020  [facility] Cardiologist:  Dr. Earney Hamburg  Subjective   Patient does complain of some chest pain this morning.  She is postop day 3 unsuccessful RCA intervention for out of hospital delayed presentation inferior myocardial infarction.  Inpatient Medications    Scheduled Meds:  amLODipine  5 mg Oral Daily   aspirin EC  81 mg Oral Daily   atorvastatin  80 mg Oral Daily   Chlorhexidine Gluconate Cloth  6 each Topical Daily   metoprolol tartrate  50 mg Oral BID   nitroGLYCERIN  0.5 inch Topical Q6H   sodium chloride flush  3 mL Intravenous Q12H   ticagrelor  90 mg Oral BID   Continuous Infusions:  sodium chloride     PRN Meds: sodium chloride, acetaminophen, ALPRAZolam, morphine injection, nitroGLYCERIN, ondansetron (ZOFRAN) IV, oxyCODONE, sodium chloride flush   Vital Signs    Vitals:   09/28/20 0600 09/28/20 0700 09/28/20 0730 09/28/20 0800  BP: (!) 106/94 115/75  113/67  Pulse: 66 72  70  Resp: Temp:   98 F (36.7 C)   TempSrc:   Oral   SpO2: 96% 94%  93%  Weight:      Height:        Intake/Output Summary (Last 24 hours) at 09/28/2020 0849 Last data filed at 09/27/2020 2100 Gross per 24 hour  Intake 483 ml  Output 1400 ml  Net -917 ml   Last 3 Weights 09/28/2020 09/26/2020 09/25/2020  Weight (lbs) 200 lb 6.4 oz 196 lb 3.4 oz 225 lb  Weight (kg) 90.9 kg 89 kg 102.059 kg      Telemetry    Sinus rhythm- Personally Reviewed  ECG    Sinus rhythm at 69 with inferior Q waves- Personally Reviewed  Physical Exam   GEN: No acute distress.   Neck: No JVD Cardiac: RRR, no murmurs, rubs, or gallops.  Respiratory: Clear to auscultation bilaterally. GI: Soft, nontender, non-distended  MS: No edema; No deformity. Neuro:  Nonfocal  Psych: Normal affect   Labs    High Sensitivity Troponin:   Recent Labs  Lab 09/25/20 1709 09/25/20 2006 09/26/20 0243   TROPONINIHS 9,554* 11,565* 11,491*      Chemistry Recent Labs  Lab 09/25/20 1702 09/25/20 1709 09/26/20 0243  NA 133* 133* 131*  K 3.8 3.3* 4.1  CL 95* 96* 97*  CO2  --  24 25  GLUCOSE 562* 477* 433*  BUN 10 9 7*  CREATININE 0.70 0.85 0.73  CALCIUM  --  9.7 9.0  PROT  --  7.8  --   ALBUMIN  --  3.7  --   AST  --  25  --   ALT  --  15  --   ALKPHOS  --  68  --   BILITOT  --  0.7  --   GFRNONAA  --  >60 >60  ANIONGAP  --  13 9     Hematology Recent Labs  Lab 09/25/20 1702 09/25/20 1709 09/26/20 0243  WBC  --  7.2  7.2 7.2  RBC  --  4.76  4.71 4.18  HGB 14.6 14.0  14.0 12.4  HCT 43.0 42.2  41.7 37.2  MCV  --  88.7  88.5 89.0  MCH  --  29.4  29.7 29.7  MCHC  --  33.2  33.6 33.3  RDW  --  12.1  12.1 12.4  PLT  --  284  280 267    BNPNo results for input(s): BNP, PROBNP in the last 168 hours.   DDimer No results for input(s): DDIMER in the last 168 hours.   Radiology    ECHOCARDIOGRAM COMPLETE  Result Date: 09/26/2020    ECHOCARDIOGRAM REPORT   Patient Name:   Sabrina Ellis Date of Exam: 09/26/2020 Medical Rec #:  [id number]            Height:       68.0 in Accession #:    [id number]           Weight:       196.2 lb Date of Birth:  1955/09/26            BSA:          2.027 m Patient Age:    64 years             BP:           127/73 mmHg Patient Gender: F                    HR:           76 bpm. Exam Location:  Inpatient Procedure: 2D Echo, Cardiac Doppler, Color Doppler and Intracardiac            Opacification Agent Indications:    R07.9* Chest pain, unspecified  History:        Patient has no prior history of Echocardiogram examinations.                 Acute MI and CAD.  Sonographer:    Roosvelt Maser RDCS Referring Phys: [id number] LAURA R INGOLD IMPRESSIONS  1. Poor quality images even with definity Markedly abnormal septal motion inferior wall hypokinesis . Left ventricular ejection fraction, by estimation, is 40 to 45%. The left ventricle has mildly  decreased function. The left ventricle demonstrates regional wall motion abnormalities (see scoring diagram/findings for description). Left ventricular diastolic parameters were normal.  2. Right ventricular systolic function is normal. The right ventricular size is normal.  3. Right atrial size was mildly dilated.  4. The mitral valve is abnormal. Trivial mitral valve regurgitation. No evidence of mitral stenosis.  5. The aortic valve is tricuspid. There is mild calcification of the aortic valve. Aortic valve regurgitation is not visualized. Mild aortic valve sclerosis is present, with no evidence of aortic valve stenosis.  6. The inferior vena cava is normal in size with greater than 50% respiratory variability, suggesting right atrial pressure of 3 mmHg. FINDINGS  Left Ventricle: Poor quality images even with definity Markedly abnormal septal motion inferior wall hypokinesis. Left ventricular ejection fraction, by estimation, is 40 to 45%. The left ventricle has mildly decreased function. The left ventricle demonstrates regional wall motion abnormalities. The left ventricular internal cavity size was normal in size. There is no left ventricular hypertrophy. Left ventricular diastolic parameters were normal. Right Ventricle: The right ventricular size is normal. No increase in right ventricular wall thickness. Right ventricular systolic function is normal. Left Atrium: Left atrial size was normal in size. Right Atrium: Right atrial size was mildly dilated. Pericardium: There is no evidence of pericardial effusion. Mitral Valve: The mitral valve is abnormal. There is mild thickening of the mitral valve leaflet(s). Trivial mitral valve regurgitation. No evidence of mitral valve stenosis. Tricuspid Valve: The tricuspid valve is normal in structure. Tricuspid valve regurgitation is mild . No evidence of tricuspid stenosis. Aortic Valve: The aortic valve  is tricuspid. There is mild calcification of the aortic valve.  Aortic valve regurgitation is not visualized. Mild aortic valve sclerosis is present, with no evidence of aortic valve stenosis. Aortic valve mean gradient measures 3.0 mmHg. Aortic valve peak gradient measures 5.5 mmHg. Aortic valve area, by VTI measures 2.96 cm. Pulmonic Valve: The pulmonic valve was normal in structure. Pulmonic valve regurgitation is not visualized. No evidence of pulmonic stenosis. Aorta: The aortic root is normal in size and structure. Venous: The inferior vena cava is normal in size with greater than 50% respiratory variability, suggesting right atrial pressure of 3 mmHg. IAS/Shunts: No atrial level shunt detected by color flow Doppler.  LEFT VENTRICLE PLAX 2D LVIDd:         4.40 cm  Diastology LVIDs:         3.10 cm  LV e' medial:    6.64 cm/s LV PW:         1.00 cm  LV E/e' medial:  8.8 LV IVS:        1.10 cm  LV e' lateral:   11.50 cm/s LVOT diam:     2.00 cm  LV E/e' lateral: 5.1 LV SV:         51 LV SV Index:   25 LVOT Area:     3.14 cm  RIGHT VENTRICLE RV Basal diam:  4.70 cm RV Mid diam:    4.20 cm RV S prime:     7.29 cm/s TAPSE (M-mode): 0.9 cm LEFT ATRIUM           Index       RIGHT ATRIUM           Index LA diam:      2.60 cm 1.28 cm/m  RA Area:     20.60 cm LA Vol (A2C): 45.1 ml 22.25 ml/m RA Volume:   74.70 ml  36.85 ml/m LA Vol (A4C): 40.7 ml 20.08 ml/m  AORTIC VALVE AV Area (Vmax):    2.85 cm AV Area (Vmean):   3.27 cm AV Area (VTI):     2.96 cm AV Vmax:           117.00 cm/s AV Vmean:          77.000 cm/s AV VTI:            0.173 m AV Peak Grad:      5.5 mmHg AV Mean Grad:      3.0 mmHg LVOT Vmax:         106.00 cm/s LVOT Vmean:        80.100 cm/s LVOT VTI:          0.163 m LVOT/AV VTI ratio: 0.94  AORTA Ao Root diam: 3.20 cm Ao Asc diam:  3.40 cm MITRAL VALVE MV Area (PHT): 5.23 cm    SHUNTS MV Decel Time: 145 msec    Systemic VTI:  0.16 m MV E velocity: 58.20 cm/s  Systemic Diam: 2.00 cm MV A velocity: 86.80 cm/s MV E/A ratio:  0.67 Charlton HawsPeter Nishan MD Electronically  signed by Charlton HawsPeter Nishan MD Signature Date/Time: 09/26/2020/11:48:16 AM    Final     Cardiac Studies   2D echocardiogram (09/26/2020)   Study Result     IMPRESSIONS     1. Poor quality images even with definity Markedly abnormal septal motion  inferior wall hypokinesis . Left ventricular ejection fraction, by  estimation, is 40 to 45%. The left ventricle has mildly decreased  function. The left ventricle demonstrates  regional wall motion abnormalities (see scoring diagram/findings for  description). Left ventricular diastolic parameters were normal.   2. Right ventricular systolic function is normal. The right ventricular  size is normal.   3. Right atrial size was mildly dilated.   4. The mitral valve is abnormal. Trivial mitral valve regurgitation. No  evidence of mitral stenosis.   5. The aortic valve is tricuspid. There is mild calcification of the  aortic valve. Aortic valve regurgitation is not visualized. Mild aortic  valve sclerosis is present, with no evidence of aortic valve stenosis.   6. The inferior vena cava is normal in size with greater than 50%  respiratory variability, suggesting right atrial pressure of 3 mmHg.   Cardiac catheterization/attempted PCI (09/25/2020)  Conclusion    Mid Cx lesion is 80% stenosed. Prox LAD lesion is 70% stenosed. Ost RCA to Dist RCA lesion is 100% stenosed.   1. Late presenting inferior ST elevation MI secondary to occlusion of the large, dominant RCA in the proximal segment. Unsuccessful attempt at PCI. There are left to right collaterals filling the distal RCA branches. 2. Moderately severe proximal LAD stenosis 3. Severe mid Circumflex stenosis   Recommendations: She is pain free. Unable to perform PCI of the RCA as I could not pass a wire beyond the totally occluded proximal vessel. She is felt to have a late presenting inferior MI. Given three vessel CAD, will consider CABG as an option for revascularization. I will admit  her to the ICU. Continue ASA/Brilinta for now. Will start a high intensity statin. Start a beta blocker. Echo in the am. CT surgery consultation this weekend to consider CABG. We will call for consultation tomorrow and if she is felt to be a good candidate for bypass, would stop the Brilinta and start IV heparin. All labs pending at this time. Coronary Diagrams   Diagnostic Dominance: Right    Intervention    Implants       Patient Profile     Patient ID  Sabrina Ellis is a 65 y.o. female with no PMH though does not follow with MD admitted with STEMI.  She currently complains of some chest wall pain.  Assessment & Plan    1: CAD-istory of delayed presentation of hospital interval myocardial infarction with failed attempt at RCA PCI by Dr. Clifton James on 09/25/2020.  She did have moderate proximal LAD disease.  She has left to distal right collaterals.  I suspect based on her hospital notes that she is poorly compliant with medications.  Dr. Clifton James suggested bypass surgery as an option however the patient is now a DNR and I suspect would not agree to this.  2: Ischemic cardiomyopathy-EF in the 40 to 45% range.  She was on high-dose beta-blocker with bradycardia.  This will be down titrated for metoprolol 12.5 mg p.o. twice daily.  She may benefit from being on a ACE inhibitor blood pressure permitting.  Because of ongoing chest pain we will change her topical nitrates to a long-acting oral nitrate nitroglycerin preparation.  3: Diabetes-Long history of diabetes on no medications.  She adamantly refuses insulin products.  She may benefit from oral hypoglycemics.  We will get the internal medicine service to further evaluate  4: Psychiatric issues-patient seems depressed.  She wished to be a DNR.  I am not sure she will be compliant with outpatient medications.  We will get psychiatry service to further evaluate.  Transfer to telemetry, we will get [facility] hospitalist to assume  primary care  we will continue to consult for cardiology issues.  For questions or updates, please contact [facility] Please consult [email or web address] for contact info under        Signed, Nanetta Batty, MD  09/28/2020, 8:49 AM

## 2020-09-28 NOTE — Progress Notes (Addendum)
   Patient admitted on 09/25/2020 with late presenting inferior MI. She was found to have 3 vessel CAD with culprit RCA occlusion. Lesion was unable to be crossed likely due to late presentation. She is currently being treated medically. There was discussion about possible CABG but it has been felt that patient may not agree to this. I was called around 6:30pm that patient was having chest pain. She was given a dose of Morphine as well as 3 doses of subglingual Nitro and was still having chest pain. I requested and EKG be done and went to bedside.  When arrive, patient was resting comfortably. She reported 6/10 burning/crushing substernal chest pain as well as right shoulder pain, nausea, and headache. She did feel like the Morphine and Nitro helped some. Pain is pleuritic and worse with deep inspiration. She states this feels very different than the pain that brought her in an not as severe.  General: 65 y.o. female resting comfortably in no acute distress. Heart: RRR. Distinct S1 and S2. No murmurs, gallops, or rubs.  Lungs: No increased work of breathing. Clear to ausculation bilaterally. No wheezes, rhonchi, or rales.  Neuro: Alert and oriented x3. No focal deficits. Psych: Normal affect. Responds appropriately.  EKG showed normal sinus rhythm with slight ST elevation in lead III and Q waves in inferior leads but no acute changes. Will add Colchicine 0.6mg  twice daily for possible pericarditis given pleuritic pain. Given recent STEMI, will hold off on NSAIDS. Discsused with Dr. Mayford Knife who agrees with this plan. Patient already has PRN Morphine ordered and can continue this throughout the night. Also on Imdur 30mg  daily. Will hold off on increasing this for now given headache with sublingual Nitro. Can give PRN Zofran for nausea. Called and spoke with the RN before leaving to update her on plan and she noted that patient was feeling much better with pain improved to 1/10.  Of note, patient expressed  some frustration about medical staff/providers not understanding her decision about refusing certain medications/procedures. Patient is currently DNR. I had long discussion with patient about DNR status and what this entails. After explaining what this means in detail, she stated she did not know that is what DNR meant. I was able to clarify a few things: she does not want to have any blood products/transfusion and she does not want to be intubated for prolonged periods of time (she does not want to be on a machine that is helping her live) but she would be OK with chest compression, defibrillation/cardioversions, BiPAP/CPAP, and ACLS medications. She would like to remain DNR at this time but this is something rounding team can continue to work on clarifying tomorrow. She does note that she has some memory issues.   , PA-C 09/28/2020 8:23 PM

## 2020-09-28 NOTE — Progress Notes (Signed)
Patient has had a few brady events with HR as low as 26 associated with periods of apnea whilst sleeping. Patient sats gave remained >93% on room air.   After one brady event, patient awoke with 8/10 CP that resolved with morphine and nitropaste. EKG completed, no changes noted. Patient currently resting, comfortable, and asleep and told to make RN aware if pain recurs or gets worse.

## 2020-09-29 ENCOUNTER — Inpatient Hospital Stay (HOSPITAL_COMMUNITY)

## 2020-09-29 DIAGNOSIS — E119 Type 2 diabetes mellitus without complications: Secondary | ICD-10-CM

## 2020-09-29 DIAGNOSIS — I2119 ST elevation (STEMI) myocardial infarction involving other coronary artery of inferior wall: Principal | ICD-10-CM

## 2020-09-29 DIAGNOSIS — I2510 Atherosclerotic heart disease of native coronary artery without angina pectoris: Secondary | ICD-10-CM

## 2020-09-29 DIAGNOSIS — I2111 ST elevation (STEMI) myocardial infarction involving right coronary artery: Secondary | ICD-10-CM | POA: Diagnosis not present

## 2020-09-29 DIAGNOSIS — I5023 Acute on chronic systolic (congestive) heart failure: Secondary | ICD-10-CM

## 2020-09-29 DIAGNOSIS — F17210 Nicotine dependence, cigarettes, uncomplicated: Secondary | ICD-10-CM

## 2020-09-29 LAB — VITAMIN B12: Vitamin B-12: 841 pg/mL (ref 180–914)

## 2020-09-29 LAB — HIV ANTIBODY (ROUTINE TESTING W REFLEX): HIV Screen 4th Generation wRfx: NONREACTIVE

## 2020-09-29 MED ORDER — INSULIN GLARGINE 100 UNIT/ML ~~LOC~~ SOLN
20.0000 [IU] | Freq: Every day | SUBCUTANEOUS | Status: DC
  Administered 2020-09-30 – 2020-10-01 (×2): 20 [IU] via SUBCUTANEOUS
  Filled 2020-09-29 (×4): qty 0.2

## 2020-09-29 MED ORDER — INSULIN ASPART 100 UNIT/ML IJ SOLN
0.0000 [IU] | Freq: Three times a day (TID) | INTRAMUSCULAR | Status: DC
  Administered 2020-09-30: 3 [IU] via SUBCUTANEOUS
  Administered 2020-09-30: 2 [IU] via SUBCUTANEOUS
  Administered 2020-10-01: 3 [IU] via SUBCUTANEOUS
  Administered 2020-10-01 – 2020-10-02 (×3): 2 [IU] via SUBCUTANEOUS
  Administered 2020-10-02: 1 [IU] via SUBCUTANEOUS

## 2020-09-29 MED ORDER — METOPROLOL SUCCINATE ER 25 MG PO TB24
12.5000 mg | ORAL_TABLET | Freq: Every day | ORAL | Status: DC
  Administered 2020-09-30 – 2020-10-02 (×3): 12.5 mg via ORAL
  Filled 2020-09-29 (×3): qty 1

## 2020-09-29 MED ORDER — SPIRONOLACTONE 12.5 MG HALF TABLET
12.5000 mg | ORAL_TABLET | Freq: Every day | ORAL | Status: DC
  Administered 2020-09-29 – 2020-10-02 (×4): 12.5 mg via ORAL
  Filled 2020-09-29 (×4): qty 1

## 2020-09-29 MED ORDER — DAPAGLIFLOZIN PROPANEDIOL 10 MG PO TABS
10.0000 mg | ORAL_TABLET | Freq: Every day | ORAL | Status: DC
  Administered 2020-09-29 – 2020-10-02 (×4): 10 mg via ORAL
  Filled 2020-09-29 (×4): qty 1

## 2020-09-29 MED ORDER — LOPERAMIDE HCL 2 MG PO CAPS
2.0000 mg | ORAL_CAPSULE | ORAL | Status: DC | PRN
  Administered 2020-09-29: 2 mg via ORAL
  Filled 2020-09-29: qty 1

## 2020-09-29 MED ORDER — INSULIN ASPART 100 UNIT/ML IJ SOLN
0.0000 [IU] | Freq: Every day | INTRAMUSCULAR | Status: DC
  Administered 2020-09-30: 2 [IU] via SUBCUTANEOUS

## 2020-09-29 NOTE — Consult Note (Addendum)
[facility] Face-to-Face Psychiatry Consult   Reason for Consult: Depression Referring Physician: Verne Carrow, MD Patient Identification: Sabrina Ellis Toft MRN:  [id number] Principal Diagnosis: <principal problem not specified> Diagnosis:  Active Problems:   STEMI (ST elevation myocardial infarction) (HCC)   Acute ST elevation myocardial infarction (STEMI) of inferior wall (HCC)   Total Time spent with patient: 1.5 hours  Subjective:   Zanayah Kendrick-Comer is a 65 y.o. female patient admitted with STEMI and concern for depression during hospitalization. Patient has been refusing some of her medications.   HPI:  On assessment patient is very verbose. Patient reports that she has significant mistrust towards doctors and spends a significant of the assessment talking about her prior experiences and provider has to redirect patient multiple times. Patient talks about how she does not feel that "doctors" listen to her and appears to have a significant issue with female doctors although she endorses some displeasure with her female PCP. Patient emphasizes that she does not feel that she is being listened to by physicians.   Patient reports that was told she was T2DM by her PCP but feels that her PCP was trying to use scare tactics on her rather than help her understand how to control her T2DM. Patient reports she wanted to know more about dietary options even if she had to take the medication. Patient also talks about her spirituality and how she does not trust anyone but God. Patient reports that that she spends her days reading the bible but she and her husband do not go to church.   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 Patient reports that she has not been caring for herself because of this and notes that she has had anhedonia, feelings of guilt,  decreased energy, and decreased appetite. Patient denies SI, HI, and AVH. Patient reports that she has been concerned that she is losing her memory and has to keep a notebook of everything; however, patient appears to have no trouble remember exact details of her healthcare regardless of how far back in time she talks about. Patient denies any hx of manic symptoms and denies symptoms of PTSD.   Past Psychiatric History: Patient reports 3 psych hospital stays when she was much younger and reports that they coincided with really bad menstruation but was told that her symptoms were "in my head" and she was later diagnosed with fibroids and had a hysterectomy. Patient reports she was on thorazine during at least one of these hospitalizations.   Risk to Self:  NO Risk to Others:NO   Prior Inpatient Therapy: YES  Prior Outpatient Therapy:  NO  Past Medical History:  Past Medical History:  Diagnosis Date   STEMI (ST elevation myocardial infarction) (HCC) 09/25/2020   Tobacco abuse stopped 5 yrs ago    Family History:  Family History  Problem Relation Age of Onset   Hypertension Mother    Family Psychiatric  History: Sister also had mood disorder that coincided with menstruation.  Social History:  Social History   Substance and Sexual Activity  Alcohol Use Not Currently     Social History   Substance and Sexual Activity  Drug Use Not Currently    Social History   Socioeconomic History   Marital status: Married    Spouse name: Not on file   Number of children: Not on file   Years of  education: Not on file   Highest education level: Not on file  Occupational History   Not on file  Tobacco Use   Smoking status: Former    Years: 45.00    Pack years: 0.00    Types: Cigarettes    Quit date: 04/2015    Years since quitting: 5.4   Smokeless tobacco: Never  Substance and Sexual Activity   Alcohol use: Not Currently   Drug use: Not Currently   Sexual activity: Yes  Other Topics  Concern   Not on file  Social History Narrative   Not on file   Social Determinants of Health   Financial Resource Strain: Not on file  Food Insecurity: Not on file  Transportation Needs: Not on file  Physical Activity: Not on file  Stress: Not on file  Social Connections: Not on file   Additional Social History:    Allergies:   Allergies  Allergen Reactions   Latex Swelling   Cashew Nut Oil Itching and Rash   Tomato Itching and Rash    Labs: No results found for this or any previous visit (from the past 48 hour(s)).  Current Facility-Administered Medications  Medication Dose Route Frequency Provider Last Rate Last Admin   0.9 %  sodium chloride infusion  250 mL Intravenous PRN Kathleene HazelMcAlhany, Christopher D, MD       acetaminophen (TYLENOL) tablet 650 mg  650 mg Oral Q4H PRN Leone BrandIngold, Laura R, NP   650 mg at 09/26/20 0158   ALPRAZolam (XANAX) tablet 0.25 mg  0.25 mg Oral BID PRN Leone BrandIngold, Laura R, NP       aspirin EC tablet 81 mg  81 mg Oral Daily Leone BrandIngold, Laura R, NP   81 mg at 09/29/20 1021   atorvastatin (LIPITOR) tablet 80 mg  80 mg Oral Daily Leone BrandIngold, Laura R, NP   80 mg at 09/29/20 1021   Chlorhexidine Gluconate Cloth 2 % PADS 6 each  6 each Topical Daily Kathleene HazelMcAlhany, Christopher D, MD   6 each at 09/29/20 1025   colchicine tablet 0.6 mg  0.6 mg Oral BID Corrin ParkerGoodrich, Callie E, PA-C   0.6 mg at 09/29/20 1021   dapagliflozin propanediol (FARXIGA) tablet 10 mg  10 mg Oral Daily Meriam SpraguePemberton, Heather E, MD       isosorbide mononitrate (IMDUR) 24 hr tablet 30 mg  30 mg Oral Daily Verne CarrowMcAlhany, Christopher D, MD   30 mg at 09/29/20 1021   [START ON 09/30/2020] metoprolol succinate (TOPROL-XL) 24 hr tablet 12.5 mg  12.5 mg Oral Daily Meriam SpraguePemberton, Heather E, MD       morphine 2 MG/ML injection 2 mg  2 mg Intravenous Q1H PRN Kathleene HazelMcAlhany, Christopher D, MD   2 mg at 09/29/20 1243   nitroGLYCERIN (NITROSTAT) SL tablet 0.4 mg  0.4 mg Sublingual Q5 Min x 3 PRN Leone BrandIngold, Laura R, NP   0.4 mg at 09/28/20 1816    ondansetron (ZOFRAN) injection 4 mg  4 mg Intravenous Q6H PRN Leone BrandIngold, Laura R, NP       oxyCODONE (Oxy IR/ROXICODONE) immediate release tablet 5-10 mg  5-10 mg Oral Q4H PRN Kathleene HazelMcAlhany, Christopher D, MD       sodium chloride flush (NS) 0.9 % injection 3 mL  3 mL Intravenous Q12H Kathleene HazelMcAlhany, Christopher D, MD   3 mL at 09/29/20 1025   sodium chloride flush (NS) 0.9 % injection 3 mL  3 mL Intravenous PRN Kathleene HazelMcAlhany, Christopher D, MD       spironolactone (ALDACTONE) tablet 12.5  mg  12.5 mg Oral Daily Meriam Sprague, MD        Musculoskeletal: Strength & Muscle Tone: within normal limits Gait & Station:  remains in bed Patient leans: N/A            Psychiatric Specialty Exam:  Presentation  General Appearance: Casual  Eye Contact:Good  Speech:Clear and Coherent  Speech Volume:Normal  Handedness: No data recorded  Mood and Affect  Mood:Euthymic  Affect:Congruent   Thought Process  Thought Processes:Coherent  Descriptions of Associations:Intact  Orientation:Full (Time, Place and Person)  Thought Content:Perseveration  History of Schizophrenia/Schizoaffective disorder:No data recorded Duration of Psychotic Symptoms:No data recorded Hallucinations:Hallucinations: None  Ideas of Reference:None  Suicidal Thoughts:Suicidal Thoughts: No  Homicidal Thoughts:Homicidal Thoughts: No   Sensorium  Memory:Immediate Good; Recent Good; Remote Poor  Judgment:Impaired  Insight:Shallow   Executive Functions  Concentration:Fair  Attention Span:Fair  Recall:Fair  Fund of Knowledge:Good  Language:Good   Psychomotor Activity  Psychomotor Activity:Psychomotor Activity: Normal   Assets  Assets:Communication Skills; Desire for Improvement; Resilience; Housing   Sleep  Sleep:Sleep: Fair   Physical Exam: Physical Exam Constitutional:      Appearance: Normal appearance.  HENT:     Head: Normocephalic and atraumatic.  Eyes:     Extraocular Movements:  Extraocular movements intact.     Conjunctiva/sclera: Conjunctivae normal.  Cardiovascular:     Rate and Rhythm: Normal rate.  Pulmonary:     Effort: Pulmonary effort is normal.     Breath sounds: Normal breath sounds.  Abdominal:     General: Abdomen is flat.  Musculoskeletal:        General: Normal range of motion.  Skin:    General: Skin is warm and dry.  Neurological:     General: No focal deficit present.     Mental Status: She is alert.   Review of Systems  Constitutional:  Negative for chills and fever.  HENT:  Negative for hearing loss.   Eyes:  Negative for blurred vision.  Respiratory:  Negative for cough and wheezing.   Cardiovascular:  Negative for chest pain.  Gastrointestinal:  Negative for abdominal pain.  Neurological:  Negative for dizziness.  Psychiatric/Behavioral:  Negative for suicidal ideas.   Blood pressure 115/89, pulse 79, temperature 98 F (36.7 C), temperature source Oral, resp. rate 18, height 5\' 8"  (1.727 m), weight 90.3 kg, SpO2 96 %. Body mass index is 30.27 kg/m.  Treatment Plan Summary: Medication management  MDD vs Depression 2/2 chronic medical illness. Patient screens positive for depression however it is unclear if patient's depression is functional or organic. Patient's A1c was elevated 14.2 suggesting chronic uncontrolled T2DM which can contribute mood dysregulation of medical illness. Patient's TSH is appropriate; however recommend B12 and possible MRI as patient has been reporting memory changes and new headaches. Patient's PCP mentioned consider a MRI approx 1 year ago due to memory concern. Notably on assessment patient did not appear to have any issues with recall but patient so concerned by her memory that she has been documenting everything in a book for quite a few months. Patient's memory issues could actually be 2/2 to depression, but other medical causes should be ruled out first.   - Recommend Zoloft 25mg  if patient is agreeable,  will reassess tom and start tom if patient wishes -Recommend RPR and B12 - Consider MRI Brain - Recommend consult to Diabetes educations - Recommend Consult to SW to address request for assistance at home for patient and her husband.  Patient notes  she has a relationship with [place] [facility].  Disposition: Patient does not meet criteria for psychiatric inpatient admission. Will reassess in the AM.   PGY-1 Bobbye Morton, MD 09/29/2020 2:28 PM

## 2020-09-29 NOTE — Consult Note (Signed)
[address]       [place] [id number]             [phone]        Domenick GongGrace Ellis Sabrina Ellis Medical Record #[id number] Date of Birth: 02/07/1956  Referring: Shari ProwsPemberton Primary Care: Pcp, No Primary Cardiologist:Christopher Clifton JamesMcAlhany, MD  Chief Complaint:  CAD  History of Present Illness:      Patient is a 65 yo AA female who states she has been sick for the past several months.  Her complaints consist of memory problems, back and chest discomfort.  She reached out to a friend who called EMS.  [facility].  EKG obtained showed ST elevation and she was ultimately [facility] emergent catheter[facility].  This was performed on 6/10 and showed multivessel CAD which was not amenable to PCI.  The patient has been on Plavix since that time.  We have been consulted today for possible coronary bypass grafting.  Currently the patient is not having any pain.  She states she has been having issues for a while, however she is difficult to follow and overall is a poor historian.  She states she has no previous medical problems, stating she has been trying to get help by doctors, but they treat her poorly and disrespectfully.  In regards to her diabetes she states she didn't know she had this.  She also mentions that she had friends die in the past from diabetic medications.  She is currently a DNR stating no one has spoken to her about what that means.  She also states she has requested help with a living will and no one has come to help her with this.  She does know [facility].  Finally she is not a Jehovah's witness but doesn't wish to receive blood products.  She would be willing to take her own blood through cell saver.  Current Activity/ Functional Status: Patient is independent with mobility/ambulation, transfers, ADL's, IADL's.   Zubrod Score: At the time of surgery this patient's  most appropriate activity status/level should be described as:  []     0    Normal activity, no symptoms []     1    Restricted in physical strenuous activity but ambulatory, able to do out light work []     2    Ambulatory and capable of self care, unable to do work activities, up and about                 more than 50%  Of the time                            []     3    Only limited self care, in bed greater than 50% of waking hours []     4    Completely disabled, no self care, confined to bed or chair []     5    Moribund  Past Medical History:  Diagnosis Date   STEMI (ST elevation myocardial infarction) (HCC) 09/25/2020   Tobacco abuse stopped 5 yrs ago       Social History   Tobacco Use  Smoking Status Former   Years: 45.00   Pack years: 0.00   Types: Cigarettes   Quit date: 04/2015   Years since quitting: 5.4  Smokeless Tobacco Never    Social  History   Substance and Sexual Activity  Alcohol Use Not Currently   Allergies  Allergen Reactions   Latex Swelling   Cashew Nut Oil Itching and Rash   Tomato Itching and Rash    Current Facility-Administered Medications  Medication Dose Route Frequency Provider Last Rate Last Admin   0.9 %  sodium chloride infusion  250 mL Intravenous PRN Kathleene Hazel, MD       acetaminophen (TYLENOL) tablet 650 mg  650 mg Oral Q4H PRN Leone Brand, NP   650 mg at 09/26/20 0158   ALPRAZolam (XANAX) tablet 0.25 mg  0.25 mg Oral BID PRN Leone Brand, NP       aspirin EC tablet 81 mg  81 mg Oral Daily Leone Brand, NP   81 mg at 09/29/20 1021   atorvastatin (LIPITOR) tablet 80 mg  80 mg Oral Daily Leone Brand, NP   80 mg at 09/29/20 1021   Chlorhexidine Gluconate Cloth 2 % PADS 6 each  6 each Topical Daily Kathleene Hazel, MD   6 each at 09/29/20 1025   colchicine tablet 0.6 mg  0.6 mg Oral BID Marjie Skiff E, PA-C   0.6 mg at 09/29/20 1021   dapagliflozin propanediol (FARXIGA) tablet 10 mg  10 mg Oral Daily  Meriam Sprague, MD       isosorbide mononitrate (IMDUR) 24 hr tablet 30 mg  30 mg Oral Daily Verne Carrow D, MD   30 mg at 09/29/20 1021   [START ON 09/30/2020] metoprolol succinate (TOPROL-XL) 24 hr tablet 12.5 mg  12.5 mg Oral Daily Meriam Sprague, MD       morphine 2 MG/ML injection 2 mg  2 mg Intravenous Q1H PRN Kathleene Hazel, MD   2 mg at 09/29/20 1243   nitroGLYCERIN (NITROSTAT) SL tablet 0.4 mg  0.4 mg Sublingual Q5 Min x 3 PRN Nada Boozer R, NP   0.4 mg at 09/28/20 1816   ondansetron (ZOFRAN) injection 4 mg  4 mg Intravenous Q6H PRN Leone Brand, NP       oxyCODONE (Oxy IR/ROXICODONE) immediate release tablet 5-10 mg  5-10 mg Oral Q4H PRN Kathleene Hazel, MD       sodium chloride flush (NS) 0.9 % injection 3 mL  3 mL Intravenous Q12H Kathleene Hazel, MD   3 mL at 09/29/20 1025   sodium chloride flush (NS) 0.9 % injection 3 mL  3 mL Intravenous PRN Kathleene Hazel, MD       spironolactone (ALDACTONE) tablet 12.5 mg  12.5 mg Oral Daily Meriam Sprague, MD        No medications prior to admission.    Family History  Problem Relation Age of Onset   Hypertension Mother    Review of Systems:   ROS    Cardiac Review of Systems: Y or  [    ]= no  Chest Pain [   Y ]  Resting SOB [  N ] Exertional SOB  [  ]  Orthopnea [  ]   Pedal Edema [   ]    Palpitations [  ] Syncope  [  ]   Presyncope [   ]  General Review of Systems: [Y] = yes [  ]=no Constitional: recent weight change [  ]; anorexia [  ]; fatigue [  ]; nausea [  ]; night sweats [  ]; fever [  ]; or chills [  ]  Dental: Last Dentist visit:   Eye : blurred vision [  ]; diplopia [   ]; vision changes [  ];  Amaurosis fugax[  ]; Resp: cough [  ];  wheezing[  ];  hemoptysis[  ]; shortness of breath[  ]; paroxysmal nocturnal dyspnea[  ]; dyspnea on exertion[  ]; or orthopnea[  ];  GI:  gallstones[  ], vomiting[   ];  dysphagia[  ]; melena[  ];  hematochezia [  ]; heartburn[  ];   Hx of  Colonoscopy[  ]; GU: kidney stones [  ]; hematuria[  ];   dysuria [  ];  nocturia[  ];  history of     obstruction [  ]; urinary frequency [  ]             Skin: rash, swelling[  ];, hair loss[  ];  peripheral edema[ N ];  or itching[  ]; Musculosketetal: myalgias[  ];  joint swelling[  ];  joint erythema[  ];  joint pain[  ];  back pain[ Y ];  Heme/Lymph: bruising[  ];  bleeding[  ];  anemia[  ];  Neuro: TIA[  ];  headaches[ Y ];  stroke[  ];  vertigo[  ];  seizures[  ];   paresthesias[  ];  difficulty walking[  ];  Psych:depression[  ]; anxiety[  ];  Endocrine: diabetes[ Y, however not on medication ];  thyroid dysfunction[  ];   Physical Exam: BP 115/89 (BP Location: Left Arm)   Pulse 79   Temp 98 F (36.7 C) (Oral)   Resp 18   Ht 5\' 8"  (1.727 m)   Wt 90.3 kg   SpO2 96%   BMI 30.27 kg/m   General appearance: cooperative, no distress, and thinking is scattered, easily confused, memory issues Head: Normocephalic, without obvious abnormality, atraumatic Resp: clear to auscultation bilaterally Cardio: regular rate and rhythm GI: soft, non-tender; bowel sounds normal; no masses,  no organomegaly Extremities: extremities normal, atraumatic, no cyanosis or edema Neurologic: Oriented to place, confused overall  Diagnostic Studies & Laboratory data:     Recent Radiology Findings:   No results found.   I have independently reviewed the above radiologic studies and discussed with the patient   Recent Lab Findings: Lab Results  Component Value Date   WBC 7.2 09/26/2020   HGB 12.4 09/26/2020   HCT 37.2 09/26/2020   PLT 267 09/26/2020   GLUCOSE 433 (H) 09/26/2020   CHOL 186 09/26/2020   TRIG 209 (H) 09/26/2020   HDL 24 (L) 09/26/2020   LDLCALC 120 (H) 09/26/2020   ALT 15 09/25/2020   AST 25 09/25/2020   NA 131 (L) 09/26/2020   K 4.1 09/26/2020   CL 97 (L) 09/26/2020   CREATININE 0.73 09/26/2020    BUN 7 (L) 09/26/2020   CO2 25 09/26/2020   TSH 1.404 09/26/2020   INR 1.1 09/25/2020   HGBA1C 14.2 (H) 09/25/2020   Assessment / Plan:      CAD- no amenable to PCI, requesting CABG consult, patient last dose of Plavix today DNR status- per patient no one has spoken to her about this.. However Dr. 11/25/2020 note states she spoke with patient and clarified her wishes.  I also spoke with the patient  Memory issues- psych consult was obtained, however patient has no memory of even what she has been told today, they are recommending MRI of brain... Im concerned patient is unable to make competent decision in regards to surgery and  expectations with recovery.  Also memory issues can worsen after cardiopulmonary bypass and patient was educated about this. DM- A1c is 14, patient will be at increased risk of infection and early graft failure.  She has continued to refuse insulin during hospitalization and states her friends were treated with medications previously as an experiment in her younger years.. needs diabetes education and treatment plan Dispo- complicated case, requesting CABG consult, however patient has multiple underlying issues that need addressed and worked up.  Concerned patient is not competent to consent to surgery and will most likely be considered high risk.  Dr. Vickey Sages to evaluate patient, but medical management may be the safest/best option for the patient.   Lowella Dandy, PA-C 09/29/2020 3:10 PM

## 2020-09-29 NOTE — Progress Notes (Addendum)
Progress Note  Patient Name: Sabrina Ellis Date of Encounter: 09/29/2020  [facility] Cardiologist: Verne Carrow, MD   Subjective   Ongoing intermittent chest pain.  Now understands meaning of DNR.  Inpatient Medications    Scheduled Meds:  aspirin EC  81 mg Oral Daily   atorvastatin  80 mg Oral Daily   Chlorhexidine Gluconate Cloth  6 each Topical Daily   clopidogrel  75 mg Oral Daily   colchicine  0.6 mg Oral BID   isosorbide mononitrate  30 mg Oral Daily   metoprolol tartrate  12.5 mg Oral BID   sodium chloride flush  3 mL Intravenous Q12H   Continuous Infusions:  sodium chloride     PRN Meds: sodium chloride, acetaminophen, ALPRAZolam, morphine injection, nitroGLYCERIN, ondansetron (ZOFRAN) IV, oxyCODONE, sodium chloride flush   Vital Signs    Vitals:   09/29/20 0311 09/29/20 0316 09/29/20 0718 09/29/20 0800  BP: 107/80  (!) 129/97 110/80  Pulse: 80  77 68  Resp: 14  17 18   Temp: 98 F (36.7 C)  98 F (36.7 C) 98.1 F (36.7 C)  TempSrc: Oral  Oral Oral  SpO2: 99%  97% 95%  Weight:  90.3 kg    Height:        Intake/Output Summary (Last 24 hours) at 09/29/2020 1020 Last data filed at 09/29/2020 0200 Gross per 24 hour  Intake 246 ml  Output 350 ml  Net -104 ml   Last 3 Weights 09/29/2020 09/28/2020 09/26/2020  Weight (lbs) 199 lb 1.6 oz 200 lb 6.4 oz 196 lb 3.4 oz  Weight (kg) 90.311 kg 90.9 kg 89 kg      Telemetry    SR- Personally Reviewed  ECG    N/A  Physical Exam   GEN: No acute distress.   Neck: No JVD Cardiac: RRR, no murmurs, rubs, or gallops.  Respiratory: Clear to auscultation bilaterally. GI: Soft, nontender, non-distended  MS: No edema; No deformity. Neuro:  Nonfocal  Psych: Normal affect   Labs    High Sensitivity Troponin:   Recent Labs  Lab 09/25/20 1709 09/25/20 2006 09/26/20 0243  TROPONINIHS 9,554* 11,565* 11,491*      Chemistry Recent Labs  Lab 09/25/20 1702 09/25/20 1709 09/26/20 0243   NA 133* 133* 131*  K 3.8 3.3* 4.1  CL 95* 96* 97*  CO2  --  24 25  GLUCOSE 562* 477* 433*  BUN 10 9 7*  CREATININE 0.70 0.85 0.73  CALCIUM  --  9.7 9.0  PROT  --  7.8  --   ALBUMIN  --  3.7  --   AST  --  25  --   ALT  --  15  --   ALKPHOS  --  68  --   BILITOT  --  0.7  --   GFRNONAA  --  >60 >60  ANIONGAP  --  13 9     Hematology Recent Labs  Lab 09/25/20 1702 09/25/20 1709 09/26/20 0243  WBC  --  7.2  7.2 7.2  RBC  --  4.76  4.71 4.18  HGB 14.6 14.0  14.0 12.4  HCT 43.0 42.2  41.7 37.2  MCV  --  88.7  88.5 89.0  MCH  --  29.4  29.7 29.7  MCHC  --  33.2  33.6 33.3  RDW  --  12.1  12.1 12.4  PLT  --  284  280 267     Radiology    No results  found.  Cardiac Studies   Echo 09/26/2020  1. Poor quality images even with definity Markedly abnormal septal motion  inferior wall hypokinesis . Left ventricular ejection fraction, by  estimation, is 40 to 45%. The left ventricle has mildly decreased  function. The left ventricle demonstrates  regional wall motion abnormalities (see scoring diagram/findings for  description). Left ventricular diastolic parameters were normal.   2. Right ventricular systolic function is normal. The right ventricular  size is normal.   3. Right atrial size was mildly dilated.   4. The mitral valve is abnormal. Trivial mitral valve regurgitation. No  evidence of mitral stenosis.   5. The aortic valve is tricuspid. There is mild calcification of the  aortic valve. Aortic valve regurgitation is not visualized. Mild aortic  valve sclerosis is present, with no evidence of aortic valve stenosis.   6. The inferior vena cava is normal in size with greater than 50%  respiratory variability, suggesting right atrial pressure of 3 mmHg.   Cath 09/25/20 CORONARY STENT INTERVENTION  LEFT HEART CATH AND CORONARY ANGIOGRAPHY    Conclusion    Mid Cx lesion is 80% stenosed. Prox LAD lesion is 70% stenosed. Ost RCA to Dist RCA lesion is  100% stenosed.   1. Late presenting inferior ST elevation MI secondary to occlusion of the large, dominant RCA in the proximal segment. Unsuccessful attempt at PCI. There are left to right collaterals filling the distal RCA branches. 2. Moderately severe proximal LAD stenosis 3. Severe mid Circumflex stenosis   Recommendations: She is pain free. Unable to perform PCI of the RCA as I could not pass a wire beyond the totally occluded proximal vessel. She is felt to have a late presenting inferior MI. Given three vessel CAD, will consider CABG as an option for revascularization. I will admit her to the ICU. Continue ASA/Brilinta for now. Will start a high intensity statin. Start a beta blocker. Echo in the am. CT surgery consultation this weekend to consider CABG. We will call for consultation tomorrow and if she is felt to be a good candidate for bypass, would stop the Brilinta and start IV heparin. All labs pending at this time.  Diagnostic Dominance: Right    Intervention  Patient Profile     64 y.o. female with no PMH though does not follow with MD admitted with STEMI.   Assessment & Plan    Late presenting inferior STEMI -2 to 3 months of episodic chest pain, had a worse episode 1 to 2 weeks ago. -Patient showed occluded large, dominant RCA in the proximal segment.  Unsuccessful attempt at PCI.  Patient had collaterals.  Moderate to severe proximal LAD stenosis.  Severe mid circumflex stenosis.  Recommended consideration for CABG.  However, did not consulted CTCS secondary to DNR status.   -Patient did not totally understand DNR.  Significant amount of time spent by Corrin Parker, PA-C yesterday, Dr. Sharl Ma and myself this morning. -Patient has memory issue and pending psychiatric evaluation -Agreeable to CABG.  Will consult surgery. -Hold Plavix if plan for surgery. -Continue aspirin 81 mg daily, Lipitor 80 mg daily, Toprol 12.5 mg twice daily and Imdur 30 mg daily  2.  Possible  pericarditis -Patient with intermittent chest pain which worse with deep breathing -Added colchicine yesterday. -Seems pain improved overnight  3.  Ischemic cardiomyopathy -Echocardiogram showed LV function of 40 to 45%.  Poor quality images even with definity Markedly abnormal septal motion  inferior wall hypokinesis .  -Plan transition  metoprolol tartrate to succinate -Consider ACE/ARB/Entresto -Appears euvolemic  4.  Uncontrolled diabetes mellitus -Hemoglobin A1c 14 -Management by internal medicine team  5.  Memory issue -Intermittent memory issue -Pending psychiatric evaluation  6.  Hyperlipidemia - 09/26/2020: Cholesterol 186; HDL 24; LDL Cholesterol 120; Triglycerides 209; VLDL 42  -High intensity statin -LDL goal less than 70 -Lipid panel and LFTs in 6 to 8 weeks  Patient prefers female provider.  Dr. Shari Prows to see.    For questions or updates, please contact [facility] Please consult [email or web address] for contact info under        SignedManson Passey, PA  09/29/2020, 10:20 AM     Patient seen and examined and agree with Chelsea Aus, PA as detailed above.  In brief, the patient is a 65 year old female with PMH of poorly controlled DMII who presented with a STEMI found to have occluded large, dominant RCA in the proximal segment with attempted but unsuccessful PCI, moderate to severe proximal LAD stenosis, severe mid circumflex stenosis.  Given multivessel, severe CAD the patient was recommended for CABG.  During her hospital course, the patient had initially stated she would like to be DNR. After a very long discussion with her today and with multiple other providers, the patient states she would like to receive resuscitation including CPR with chest compressions and shock if needed, but would not like to live "tied to machines" or have sustained resuscitative efforts if she would have no meaningful recovery that would require her to be bedbound and tied to  machines. She understands that surgery is the best option for her CAD and she is amenable to proceed and change her code status for the procedure. She would also like to further discuss a living will with SW.   Currently, the patient is chest pain free and comfortable. She will be seen by CV surgery as well as psychiatry to help guide management going forward.  GEN: No acute distress. Comfortable and sitting up in bed  Neck: No JVD Cardiac: RRR, no murmurs, rubs, or gallops.  Respiratory: Clear to auscultation bilaterally. GI: Soft, nontender, non-distended  MS: No edema; No deformity. Neuro:  Nonfocal  Psych: Normal affect    Plan: -Patient amenable for surgery for underlying severe CAD; CV surgery consulted  -After very long discussion with the patient, she is not DNR but would like CPR including chest compressions and shock therapy if needed; she would not like to be maintained on "machines" or have prolonged resuscitation efforts if it meant no meaningful recovery -Continue ASA 81mg  daily, continue lipitor 80mg  daily -Hold plavix now given possible plans for OR with surgery -Continue imdur 30mg  daily -Change metoprolol tartrate to succinate 12.5mg  XL -Will add entresto post-op as tolerated -Start spironolactone 12.5mg  daily -Start farxiga 10mg  daily -Psychiatry consulted, appreciate recommendations -Internal medicine consulted, appreciate recommendations -Agree with DM education -Patient requesting SW consult to help clarify living will  Total time of encounter: 60 minutes total time of encounter, including 45 minutes spent in face-to-face patient care on the date of this encounter. This time includes coordination of care and counseling regarding above mentioned problem list. Remainder of non-face-to-face time involved reviewing chart documents/testing relevant to the patient encounter and documentation in the medical record. I have independently reviewed documentation from referring  provider.   , MD

## 2020-09-29 NOTE — Progress Notes (Signed)
Pt with frustration about difficulty sleeping due to reflection lights from bottom of the bed.  Multiple attempts made to block lighting with blankets.  Patient requested for the bed lights to be turned off.  RN explained that the only way this could occur was to unplug to bed and this was not ideal due to safety.  Patient understands risks and wishes for the bed to be unplugged. RN requested that patient call when she needs to ambulate. Patient agrees at this time.  Call bell within reach of patient.

## 2020-09-29 NOTE — Progress Notes (Signed)
[facility]  PROGRESS NOTE  Sabrina Ellis BEM:[id number] DOB: August 15, 1955 DOA: 09/25/2020 PCP: Pcp, No   Brief HPI:   65 year old female with history of diabetes mellitus type 2, who does not follow with PCP came to hospital with episodic chest pain for past 2 to 3 months.  She was taken to [facility] where EKG showed ST elevation in 2 3 aVF.  She was brought to [facility] for evaluation for cardiac catheterization.    Cardiac cath showed inferior STEMI secondary to occlusion of large, dominant RCA proximally.  PCI was unsuccessful.  Patient also was found to have moderate to severe proximal LAD stenosis.  CABG was recommended.  Patient had earlier refused CABG.      Subjective   Patient seen and examined, says that no one had explained her the procedure for CABG.  Also wants clarity on DNR status.  She seems to have been having memory problems for past few months.  She has everything in notebook as she seems to have short-term memory loss.  Hemoglobin A1c is 14.2.   Assessment/Plan:    Late presenting inferior STEMI -Patient presented with intermittent chest pain -Cardiac cath showed occluded large dominant RCA in the proximal segment, unsuccessful attempted PCI -Also showed moderate to severe proximal LAD stenosis, severe mid circumflex stenosis -CABG was recommended -Earlier patient had refused CABG, today she is agreeable for CABG. -We will let cardiology know about patient's decision   Memory problems -Patient seems to have short-term memory loss -Found to have new onset diabetes, hemoglobin A1c is 14.2. -We will check B12, RPR, MRI brain  Depression -Patient seems to be depressed due to ongoing personal problems at home -We will consult psychiatry for further recommendations   Diabetes mellitus uncontrolled -Hemoglobin A1c elevated at 14.2 -CBG significantly elevated -Start Lantus 20 units subcu daily; continue Farxiga -Start sliding scale  insulin with NovoLog -Diabetes education  Hyperlipidemia -Continue atorvastatin 80 mg daily   Advance directives -Discussed in detail regarding DNR status -After discussion with cardiology patient is currently partial code -TOC consulted for helping to make living will    Scheduled medications:    aspirin EC  81 mg Oral Daily   atorvastatin  80 mg Oral Daily   Chlorhexidine Gluconate Cloth  6 each Topical Daily   colchicine  0.6 mg Oral BID   dapagliflozin propanediol  10 mg Oral Daily   isosorbide mononitrate  30 mg Oral Daily   [START ON 09/30/2020] metoprolol succinate  12.5 mg Oral Daily   sodium chloride flush  3 mL Intravenous Q12H   spironolactone  12.5 mg Oral Daily         Data Reviewed:   CBG:  No results for input(s): GLUCAP in the last 168 hours.  SpO2: 96 % O2 Flow Rate (L/min): 2 L/min    Vitals:   09/29/20 0316 09/29/20 0718 09/29/20 0800 09/29/20 1100  BP:  (!) 129/97 110/80 115/89  Pulse:  77 68 79  Resp:  17 18 18   Temp:  98 F (36.7 C) 98.1 F (36.7 C) 98 F (36.7 C)  TempSrc:  Oral Oral Oral  SpO2:  97% 95% 96%  Weight: 90.3 kg     Height:         Intake/Output Summary (Last 24 hours) at 09/29/2020 1543 Last data filed at 09/29/2020 1100 Gross per 24 hour  Intake 363 ml  Output 350 ml  Net 13 ml    06/12 1901 - 06/14 0700 In:  486 [P.O.:480; I.V.:6] Out: 1550 [Urine:1550]  Filed Weights   09/26/20 0500 09/28/20 0500 09/29/20 0316  Weight: 89 kg 90.9 kg 90.3 kg    CBC:  Recent Labs  Lab 09/25/20 1702 09/25/20 1709 09/26/20 0243  WBC  --  7.2  7.2 7.2  HGB 14.6 14.0  14.0 12.4  HCT 43.0 42.2  41.7 37.2  PLT  --  284  280 267  MCV  --  88.7  88.5 89.0  MCH  --  29.4  29.7 29.7  MCHC  --  33.2  33.6 33.3  RDW  --  12.1  12.1 12.4  LYMPHSABS  --  2.5  --   MONOABS  --  0.6  --   EOSABS  --  0.1  --   BASOSABS  --  0.0  --     Complete metabolic panel:  Recent Labs  Lab 09/25/20 1702 09/25/20 1709  09/25/20 1823 09/25/20 2152 09/26/20 0000 09/26/20 0243  NA 133* 133*  --   --   --  131*  K 3.8 3.3*  --   --   --  4.1  CL 95* 96*  --   --   --  97*  CO2  --  24  --   --   --  25  GLUCOSE 562* 477*  --   --   --  433*  BUN 10 9  --   --   --  7*  CREATININE 0.70 0.85  --   --   --  0.73  CALCIUM  --  9.7  --   --   --  9.0  AST  --  25  --   --   --   --   ALT  --  15  --   --   --   --   ALKPHOS  --  68  --   --   --   --   BILITOT  --  0.7  --   --   --   --   ALBUMIN  --  3.7  --   --   --   --   MG  --   --  2.1  --   --   --   INR  --   --   --  1.1  --   --   TSH  --   --   --   --  1.404  --   HGBA1C  --   --  14.2*  --   --   --     No results for input(s): LIPASE, AMYLASE in the last 168 hours.  Recent Labs  Lab 09/25/20 1643  SARSCOV2NAA NEGATIVE    ------------------------------------------------------------------------------------------------------------------ No results for input(s): CHOL, HDL, LDLCALC, TRIG, CHOLHDL, LDLDIRECT in the last 72 hours.  Lab Results  Component Value Date   HGBA1C 14.2 (H) 09/25/2020   ------------------------------------------------------------------------------------------------------------------ No results for input(s): TSH, T4TOTAL, T3FREE, THYROIDAB in the last 72 hours.  Invalid input(s): FREET3 ------------------------------------------------------------------------------------------------------------------ No results for input(s): VITAMINB12, FOLATE, FERRITIN, TIBC, IRON, RETICCTPCT in the last 72 hours.  Coagulation profile Recent Labs  Lab 09/25/20 2152  INR 1.1   No results for input(s): DDIMER in the last 72 hours.  Cardiac Enzymes No results for input(s): CKTOTAL, CKMB, CKMBINDEX, TROPONINI in the last 168 hours.  ------------------------------------------------------------------------------------------------------------------ No results found for: BNP   Antibiotics: Anti-infectives (From  admission, onward)    None  Radiology Reports  No results found.    DVT prophylaxis: SCDs  Code Status: Full code  Family Communication: No family at bedside   Objective    Physical Examination:  General-appears in no acute distress Heart-S1-S2, regular, no murmur auscultated Lungs-clear to auscultation bilaterally, no wheezing or crackles auscultated Abdomen-soft, nontender, no organomegaly Extremities-no edema in the lower extremities Neuro-alert, oriented x3, no focal deficit noted  Status is: Inpatient  Dispo: The patient is from: Home              Anticipated d/c is to: Home              Anticipated d/c date is: 10/03/2020              Patient currently not stable for discharge  Barrier to discharge-ongoing evaluation and management for STEMI  COVID-19 Labs  No results for input(s): DDIMER, FERRITIN, LDH, CRP in the last 72 hours.  Lab Results  Component Value Date   SARSCOV2NAA NEGATIVE 09/25/2020    Microbiology  Recent Results (from the past 240 hour(s))  Resp Panel by RT-PCR (Flu A&B, Covid) Nasopharyngeal Swab     Status: None   Collection Time: 09/25/20  4:43 PM   Specimen: Nasopharyngeal Swab; Nasopharyngeal(NP) swabs in vial transport medium  Result Value Ref Range Status   SARS Coronavirus 2 by RT PCR NEGATIVE NEGATIVE Final    Comment: (NOTE) SARS-CoV-2 target nucleic acids are NOT DETECTED.  The SARS-CoV-2 RNA is generally detectable in upper respiratory specimens during the acute phase of infection. The lowest concentration of SARS-CoV-2 viral copies this assay can detect is 138 copies/mL. A negative result does not preclude SARS-Cov-2 infection and should not be used as the sole basis for treatment or other patient management decisions. A negative result may occur with  improper specimen collection/handling, submission of specimen other than nasopharyngeal swab, presence of viral mutation(s) within the areas targeted by this  assay, and inadequate number of viral copies(<138 copies/mL). A negative result must be combined with clinical observations, patient history, and epidemiological information. The expected result is Negative.  Fact Sheet for Patients:  BloggerCourse.com  Fact Sheet for Healthcare Providers:  SeriousBroker.it  This test is no t yet approved or cleared by the [place] FDA and  has been authorized for detection and/or diagnosis of SARS-CoV-2 by FDA under an Emergency Use Authorization (EUA). This EUA will remain  in effect (meaning this test can be used) for the duration of the COVID-19 declaration under Section 564(b)(1) of the Act, 21 U.S.C.section 360bbb-3(b)(1), unless the authorization is terminated  or revoked sooner.       Influenza A by PCR NEGATIVE NEGATIVE Final   Influenza B by PCR NEGATIVE NEGATIVE Final    Comment: (NOTE) The Xpert Xpress SARS-CoV-2/FLU/RSV plus assay is intended as an aid in the diagnosis of influenza from Nasopharyngeal swab specimens and should not be used as a sole basis for treatment. Nasal washings and aspirates are unacceptable for Xpert Xpress SARS-CoV-2/FLU/RSV testing.  Fact Sheet for Patients: BloggerCourse.com  Fact Sheet for Healthcare Providers: SeriousBroker.it  This test is not yet approved or cleared by the [place] FDA and has been authorized for detection and/or diagnosis of SARS-CoV-2 by FDA under an Emergency Use Authorization (EUA). This EUA will remain in effect (meaning this test can be used) for the duration of the COVID-19 declaration under Section 564(b)(1) of the Act, 21 U.S.C. section 360bbb-3(b)(1), unless the authorization is terminated or revoked.  Performed at [facility]  [facility], [address] [place], [place] [address]   MRSA PCR Screening     Status: None   Collection Time: 09/25/20  6:00 PM    Specimen: Nasopharyngeal  Result Value Ref Range Status   MRSA by PCR NEGATIVE NEGATIVE Final    Comment:        The GeneXpert MRSA Assay (FDA approved for NASAL specimens only), is one component of a comprehensive MRSA colonization surveillance program. It is not intended to diagnose MRSA infection nor to guide or monitor treatment for MRSA infections. Performed at [facility], [address] [place], [place] [address]              Meredeth IdeGagan S Nasia Cannan   [facility] If 7PM-7AM, please contact night-coverage at [email or web address], Office  [phone]   09/29/2020, 3:43 PM  LOS: 4 days

## 2020-09-29 NOTE — Progress Notes (Signed)
Patient refuses her CBG and her lantus. States she isn't taking any new medicine until she speaks w/ MD. Dr. Loney Loh notified via [facility] communication.,

## 2020-09-29 NOTE — Progress Notes (Signed)
Night float progress note  Informed by RN that patient is refusing insulin and CBG checks.  A1c 14.2 on 09/25/2020.  I had a very lengthy conversation with the patient about the risks associated with uncontrolled diabetes.  Patient stated she is scared of needles and will not take insulin or allow CBG checks under any circumstance.  -Continue to educate -Consult diabetes educator

## 2020-09-30 DIAGNOSIS — I639 Cerebral infarction, unspecified: Secondary | ICD-10-CM | POA: Diagnosis not present

## 2020-09-30 DIAGNOSIS — I2111 ST elevation (STEMI) myocardial infarction involving right coronary artery: Secondary | ICD-10-CM | POA: Diagnosis not present

## 2020-09-30 DIAGNOSIS — I2119 ST elevation (STEMI) myocardial infarction involving other coronary artery of inferior wall: Secondary | ICD-10-CM | POA: Diagnosis not present

## 2020-09-30 LAB — GLUCOSE, CAPILLARY
Glucose-Capillary: 209 mg/dL — ABNORMAL HIGH (ref 70–99)
Glucose-Capillary: 189 mg/dL — ABNORMAL HIGH (ref 70–99)
Glucose-Capillary: 176 mg/dL — ABNORMAL HIGH (ref 70–99)
Glucose-Capillary: 249 mg/dL — ABNORMAL HIGH (ref 70–99)

## 2020-09-30 LAB — CBC
HCT: 39.1 % (ref 36.0–46.0)
Hemoglobin: 12.7 g/dL (ref 12.0–15.0)
MCH: 29.4 pg (ref 26.0–34.0)
MCHC: 32.5 g/dL (ref 30.0–36.0)
MCV: 90.5 fL (ref 80.0–100.0)
Platelets: 338 10*3/uL (ref 150–400)
RBC: 4.32 MIL/uL (ref 3.87–5.11)
RDW: 12.3 % (ref 11.5–15.5)
WBC: 6.6 10*3/uL (ref 4.0–10.5)
nRBC: 0 % (ref 0.0–0.2)

## 2020-09-30 LAB — COMPREHENSIVE METABOLIC PANEL
ALT: 16 U/L (ref 0–44)
AST: 21 U/L (ref 15–41)
Albumin: 3.5 g/dL (ref 3.5–5.0)
Alkaline Phosphatase: 64 U/L (ref 38–126)
Anion gap: 9 (ref 5–15)
BUN: 12 mg/dL (ref 8–23)
CO2: 26 mmol/L (ref 22–32)
Calcium: 9.7 mg/dL (ref 8.9–10.3)
Chloride: 101 mmol/L (ref 98–111)
Creatinine, Ser: 0.91 mg/dL (ref 0.44–1.00)
GFR, Estimated: >60 mL/min (ref >60)
Glucose, Bld: 239 mg/dL — ABNORMAL HIGH (ref 70–99)
Potassium: 3.5 mmol/L (ref 3.5–5.1)
Sodium: 136 mmol/L (ref 135–145)
Total Bilirubin: 0.5 mg/dL (ref 0.3–1.2)
Total Protein: 7.7 g/dL (ref 6.5–8.1)

## 2020-09-30 LAB — RPR: RPR Ser Ql: NONREACTIVE

## 2020-09-30 MED ORDER — INSULIN STARTER KIT- PEN NEEDLES (ENGLISH)
1.0000 | Freq: Once | Status: DC
  Filled 2020-09-30 (×2): qty 1

## 2020-09-30 MED ORDER — LIVING WELL WITH DIABETES BOOK
Freq: Once | Status: DC
  Filled 2020-09-30 (×2): qty 1

## 2020-09-30 MED ORDER — HYDROXYZINE HCL 25 MG PO TABS: 25.0000 mg | ORAL_TABLET | Freq: Three times a day (TID) | ORAL | Status: DC | PRN

## 2020-09-30 NOTE — Progress Notes (Signed)
Patient c/o of CP 7/10 after ambulating to the bathroom. EKG performed. Morphine given. Morphine effective.

## 2020-09-30 NOTE — Progress Notes (Signed)
Insulin education conducted with the patient. Discussed rotation of sites, drawing up insulin, and how to give the injections. Patient turned her head when RN gave injection to right abdomen. Encouraged patient to watch administration but she still has a fear of needles.

## 2020-09-30 NOTE — Progress Notes (Signed)
[facility] PROGRESS NOTE    Abriel Kendrick-Comer  BTD:[id number] DOB: [birth year] DOA: 09/25/2020 PCP: Pcp, No     Brief Narrative:  Ruwayda Kendrick-Comer is a 65 year old female with history of diabetes mellitus type 2, who does not follow with PCP, who came to hospital with episodic chest pain for past 2 to 3 months.  She was taken to [facility] where EKG showed ST elevation in leads 2, 3, aVF.  She was brought to [facility] for evaluation for cardiac catheterization. Cardiac cath showed inferior STEMI secondary to occlusion of large, dominant RCA proximally.  PCI was unsuccessful.  Patient also was found to have moderate to severe proximal LAD stenosis.  CABG was recommended.  Hospitalization further complicated by patient's underlying psych issues, memory loss.   New events last 24 hours / Subjective: Patient very verbose.  She tells me that she was finally able to overcome her anxiety over glucose checks and insulin injections while psychiatry MD was in the room with her this morning.  Patient feels that she has overcome a large hurdle.  She tells me about her husband's psych issues, financial difficulties, and that he currently remains at a care facility.  She tells me that she has significant memory issues, memory loss, writes down everything in her notebook.  She is able to verbalize plans for CABG, although she thinks that if she is unable to care for her diabetes and hypertension adequately, she feels that undergoing open heart surgery is of limited value.  She tells me about her distress and physicians, feels that her previous PCP did not listen to her, she was also telling me about her [employer] insurance issues.  I was unable to clarify CODE STATUS with her, discussed what CODE STATUS means, meaning of DNR versus full code.  Per previous documentation, patient remains a partial code currently.  Assessment & Plan:   Active Problems:   STEMI (ST elevation myocardial infarction) (HCC)   Acute  ST elevation myocardial infarction (STEMI) of inferior wall (HCC)   STEMI -Cardiac cath showed occluded large dominant RCA in the proximal segment, unsuccessful attempted PCI -Also showed moderate to severe proximal LAD stenosis, severe mid circumflex stenosis -CABG was recommended -Cardiology, cardiothoracic surgery following -Continue aspirin, Lipitor, metoprolol  Ischemic cardiomyopathy -Continue metoprolol, spironolactone, Farxiga, imdur   CVA -MRI brain: Small foci of acute ischemia within both cerebellar hemispheres and right parietal lobe. No hemorrhage or mass effect. Findings of chronic microvascular ischemia. -Neurology consulted  Short term memory loss -RPR NR -HIV NR -Vitamin B12 within normal  Depression/anxiety  -Psych following -Started on atarax   DM 2, uncontrolled with hyperglycemia -Continue Lantus, sliding scale insulin -Diabetic coordinator consult  Hyperlipidemia -Continue Lipitor   DVT prophylaxis:  Place and maintain sequential compression device Start: 09/29/20 1555 SCD's Start: 09/25/20 1836  Code Status:     Code Status Orders  (From admission, onward)           Start     Ordered   09/29/20 1302  Limited resuscitation (code)  Continuous       Question Answer Comment  In the event of cardiac or respiratory ARREST: Initiate Code Blue, Call Rapid Response Yes   In the event of cardiac or respiratory ARREST: Perform CPR Yes   In the event of cardiac or respiratory ARREST: Perform Intubation/Mechanical Ventilation Yes   In the event of cardiac or respiratory ARREST: Use NIPPV/BiPAp only if indicated Yes   In the event of cardiac or respiratory  ARREST: Administer ACLS medications if indicated Yes   In the event of cardiac or respiratory ARREST: Perform Defibrillation or Cardioversion if indicated Yes   Comments Patient would like all resucitation efforts performed but would not like to be maintained on machines for a prolonged period of  time or prolonged resuscitation if deemed unlikely to have meaningful recovery      09/29/20 1303           Code Status History     Date Active Date Inactive Code Status Order ID Comments User Context   09/25/2020 2252 09/29/2020 1301 DNR [id number]  Marykay Lex, MD Inpatient   09/25/2020 1839 09/25/2020 2252 Full Code [id number]  Isaiah Serge, NP Inpatient   09/25/2020 1839 09/25/2020 1839 Full Code [id number]  Burnell Blanks, MD Inpatient      Family Communication: None at bedside  Disposition Plan:  Status is: Inpatient  Remains inpatient appropriate because:Ongoing diagnostic testing needed not appropriate for outpatient work up  Dispo: The patient is from: Home              Anticipated d/c is to: Home              Patient currently is not medically stable to d/c.   Difficult to place patient No      Antimicrobials:  Anti-infectives (From admission, onward)    None        Objective: Vitals:   09/29/20 1100 09/29/20 1922 09/30/20 0315 09/30/20 1117  BP: 115/89 (!) 162/82 (!) 160/85 114/60  Pulse: 79 91 87 83  Resp: _0 Temp: 98 F (36.7 C) 99.6 F (37.6 C) 98.7 F (37.1 C) 98.6 F (37 C)  TempSrc: Oral Oral Oral Oral  SpO2: 96% 93% 97% 95%  Weight:   88.8 kg   Height:        Intake/Output Summary (Last 24 hours) at 09/30/2020 1431 Last data filed at 09/30/2020 0331 Gross per 24 hour  Intake 240 ml  Output --  Net 240 ml   Filed Weights   09/28/20 0500 09/29/20 0316 09/30/20 0315  Weight: 90.9 kg 90.3 kg 88.8 kg    Examination:  General exam: Appears calm and comfortable  Respiratory system: Clear to auscultation. Respiratory effort normal. No respiratory distress. No conversational dyspnea.  Cardiovascular system: S1 & S2 heard, RRR. No murmurs. No pedal edema. Gastrointestinal system: Abdomen is nondistended, soft and nontender. Normal bowel sounds heard. Central nervous system: Alert and oriented. No focal neurological  deficits. Speech clear.  Extremities: Symmetric in appearance  Skin: No rashes, lesions or ulcers on exposed skin  Psychiatry: Mood & affect appropriate.   Data Reviewed: I have personally reviewed following labs and imaging studies  CBC: Recent Labs  Lab 09/25/20 1702 09/25/20 1709 09/26/20 0243 09/30/20 0341  WBC  --  7.2  7.2 7.2 6.6  NEUTROABS  --  4.0  --   --   HGB 14.6 14.0  14.0 12.4 12.7  HCT 43.0 42.2  41.7 37.2 39.1  MCV  --  88.7  88.5 89.0 90.5  PLT  --  284  280 267 416   Basic Metabolic Panel: Recent Labs  Lab 09/25/20 1702 09/25/20 1709 09/25/20 1823 09/26/20 0243 09/30/20 0341  NA 133* 133*  --  131* 136  K 3.8 3.3*  --  4.1 3.5  CL 95* 96*  --  97* 101  CO2  --  24  --  25 26  GLUCOSE 562* 477*  --  433* 239*  BUN 10 9  --  7* 12  CREATININE 0.70 0.85  --  0.73 0.91  CALCIUM  --  9.7  --  9.0 9.7  MG  --   --  2.1  --   --    GFR: Estimated Creatinine Clearance: 72.9 mL/min (by C-G formula based on SCr of 0.91 mg/dL). Liver Function Tests: Recent Labs  Lab 09/25/20 1709 09/30/20 0341  AST 25 21  ALT 15 16  ALKPHOS 68 64  BILITOT 0.7 0.5  PROT 7.8 7.7  ALBUMIN 3.7 3.5   No results for input(s): LIPASE, AMYLASE in the last 168 hours. No results for input(s): AMMONIA in the last 168 hours. Coagulation Profile: Recent Labs  Lab 09/25/20 2152  INR 1.1   Cardiac Enzymes: No results for input(s): CKTOTAL, CKMB, CKMBINDEX, TROPONINI in the last 168 hours. BNP (last 3 results) No results for input(s): PROBNP in the last 8760 hours. HbA1C: No results for input(s): HGBA1C in the last 72 hours. CBG: Recent Labs  Lab 09/30/20 0944 09/30/20 1119  GLUCAP 209* 189*   Lipid Profile: No results for input(s): CHOL, HDL, LDLCALC, TRIG, CHOLHDL, LDLDIRECT in the last 72 hours. Thyroid Function Tests: No results for input(s): TSH, T4TOTAL, FREET4, T3FREE, THYROIDAB in the last 72 hours. Anemia Panel: Recent Labs    09/29/20 1608   VITAMINB12 841   Sepsis Labs: No results for input(s): PROCALCITON, LATICACIDVEN in the last 168 hours.  Recent Results (from the past 240 hour(s))  Resp Panel by RT-PCR (Flu A&B, Covid) Nasopharyngeal Swab     Status: None   Collection Time: 09/25/20  4:43 PM   Specimen: Nasopharyngeal Swab; Nasopharyngeal(NP) swabs in vial transport medium  Result Value Ref Range Status   SARS Coronavirus 2 by RT PCR NEGATIVE NEGATIVE Final    Comment: (NOTE) SARS-CoV-2 target nucleic acids are NOT DETECTED.  The SARS-CoV-2 RNA is generally detectable in upper respiratory specimens during the acute phase of infection. The lowest concentration of SARS-CoV-2 viral copies this assay can detect is 138 copies/mL. A negative result does not preclude SARS-Cov-2 infection and should not be used as the sole basis for treatment or other patient management decisions. A negative result may occur with  improper specimen collection/handling, submission of specimen other than nasopharyngeal swab, presence of viral mutation(s) within the areas targeted by this assay, and inadequate number of viral copies(<138 copies/mL). A negative result must be combined with clinical observations, patient history, and epidemiological information. The expected result is Negative.  Fact Sheet for Patients:  EntrepreneurPulse.com.au  Fact Sheet for Healthcare Providers:  IncredibleEmployment.be  This test is no t yet approved or cleared by the [place] FDA and  has been authorized for detection and/or diagnosis of SARS-CoV-2 by FDA under an Emergency Use Authorization (EUA). This EUA will remain  in effect (meaning this test can be used) for the duration of the COVID-19 declaration under Section 564(b)(1) of the Act, 21 U.S.C.section 360bbb-3(b)(1), unless the authorization is terminated  or revoked sooner.       Influenza A by PCR NEGATIVE NEGATIVE Final   Influenza B by PCR  NEGATIVE NEGATIVE Final    Comment: (NOTE) The Xpert Xpress SARS-CoV-2/FLU/RSV plus assay is intended as an aid in the diagnosis of influenza from Nasopharyngeal swab specimens and should not be used as a sole basis for treatment. Nasal washings and aspirates are unacceptable for Xpert Xpress SARS-CoV-2/FLU/RSV testing.  Fact Sheet for Patients:  EntrepreneurPulse.com.au  Fact Sheet for Healthcare Providers: IncredibleEmployment.be  This test is not yet approved or cleared by the [place] FDA and has been authorized for detection and/or diagnosis of SARS-CoV-2 by FDA under an Emergency Use Authorization (EUA). This EUA will remain in effect (meaning this test can be used) for the duration of the COVID-19 declaration under Section 564(b)(1) of the Act, 21 U.S.C. section 360bbb-3(b)(1), unless the authorization is terminated or revoked.  Performed at [facility], [place] [address], [place], [place] [address]   MRSA PCR Screening     Status: None   Collection Time: 09/25/20  6:00 PM   Specimen: Nasopharyngeal  Result Value Ref Range Status   MRSA by PCR NEGATIVE NEGATIVE Final    Comment:        The GeneXpert MRSA Assay (FDA approved for NASAL specimens only), is one component of a comprehensive MRSA colonization surveillance program. It is not intended to diagnose MRSA infection nor to guide or monitor treatment for MRSA infections. Performed at [facility], [place] [address], [place], [place] [address]       Radiology Studies: MR BRAIN WO CONTRAST  Result Date: 09/29/2020 CLINICAL DATA:  Memory loss EXAM: MRI HEAD WITHOUT CONTRAST TECHNIQUE: Multiplanar, multiecho pulse sequences of the brain and surrounding structures were obtained without intravenous contrast. COMPARISON:  None. FINDINGS: Brain: Small foci of abnormal diffusion restriction within both cerebellar hemispheres and in the right parietal lobe. Single focus of  chronic microhemorrhage in the right temporal lobe. There is multifocal hyperintense T2-weighted signal within the white matter. Parenchymal volume and CSF spaces are normal. The midline structures are normal. Vascular: Major flow voids are preserved. Skull and upper cervical spine: Normal calvarium and skull base. Visualized upper cervical spine and soft tissues are normal. Sinuses/Orbits:No paranasal sinus fluid levels or advanced mucosal thickening. No mastoid or middle ear effusion. Normal orbits. IMPRESSION: 1. Small foci of acute ischemia within both cerebellar hemispheres and right parietal lobe. No hemorrhage or mass effect. 2. Findings of chronic microvascular ischemia. Electronically Signed   By: Ulyses Jarred M.D.   On: 09/29/2020 23:01      Scheduled Meds:  aspirin EC  81 mg Oral Daily   atorvastatin  80 mg Oral Daily   Chlorhexidine Gluconate Cloth  6 each Topical Daily   colchicine  0.6 mg Oral BID   dapagliflozin propanediol  10 mg Oral Daily   insulin aspart  0-5 Units Subcutaneous QHS   insulin aspart  0-9 Units Subcutaneous TID WC   insulin glargine  20 Units Subcutaneous QHS   insulin starter kit- pen needles  1 kit Other Once   isosorbide mononitrate  30 mg Oral Daily   living well with diabetes book   Does not apply Once   metoprolol succinate  12.5 mg Oral Daily   sodium chloride flush  3 mL Intravenous Q12H   spironolactone  12.5 mg Oral Daily   Continuous Infusions:  sodium chloride       LOS: 5 days      Time spent: 40 minutes   Dessa Phi, DO [facility] 09/30/2020, 2:31 PM   Available via Epic secure chat 7am-7pm After these hours, please refer to coverage provider listed on [email or web address]
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
## 2020-09-30 NOTE — Progress Notes (Signed)
RN witnessed telephone conversation between patient and Dr. Loney Loh. Dr. Loney Loh educated the patient on rationale for CBG checks and insulin. Dr. Loney Loh explained at length the A1c and the correlation between disease process and uncontrolled DM. Patient adamant that she will not allow CBG checks or insulin under any circumstances. Handed the phone back to RN during the conversation with Dr. Loney Loh even though Dr. Loney Loh was on speaker phone and patient said "I am done".

## 2020-09-30 NOTE — Consult Note (Signed)
[facility] Consult   Reason for Consult: Concern for depression Referring Physician:  Lauree Chandler, MD Patient Identification: Sabrina Ellis MRN:  [id number] Principal Diagnosis: <principal problem not specified> Diagnosis:  Active Problems:   STEMI (ST elevation myocardial infarction) ([place])   Acute ST elevation myocardial infarction (STEMI) of inferior wall ([place])   Total Time spent with patient: 30 minutes  Subjective:   Sabrina Ellis is a 65 y.o. female patient admitted with  STEMI and concern for depression during hospitalization. Patient has been refusing some of her medications.  HPI:  Overnight patient MRI resulted that patient has several microhemorrhages on bilateral hemispheres likely 2/2 uncontrolled BP, neuro has been consulted. Patient B12 was WNL, RPR and HIV were neg.   On assessment this AM patient reports she is not interested in taking Zoloft due to concern for side effects. Patient and provider discuss that the side effects are very unlikely and provider reiterates why patient could benefit from the medication. Patient remains steadfast and reports she does not think she is depressed. Patient communicates that she feels overwhelmed and confused by her health and is still confused about how she had a heart attack and how this has anything to do with her T2DM. Provider and patient discussed what MI actually is and the risk factors and why it is important for patient to be taking care of her chronic illnesses. 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 Patient reports that this is why she is not taking the insulin and is based on conversation she is starting to understand why it is important. Patient reports significant phobia and recalls all the times in her life her phobia of needles has caused her significant issues. Patient  decides she is willing to try again with provider in the room. RN came and briefly explained what would happen and patient was able to get her BGL and insulin. Patient reports she was very happy that she was able to follow through but provider did note that patient hyperventilated and became diaphoretic during the process. Patient is adamant she is not suicidal but harbors significant mistrust towards healthcare and is also very confused about her own health. Patient denies HI and AVH.  Patient reported she is willing to try PRN medication, today, for anxiety to help her get through injections.  Past Psychiatric History: Patient reports 3 psych hospital stays when she was much younger and reports that they coincided with really bad menstruation but was told that her symptoms were "in my head" and she was later diagnosed with fibroids and had a hysterectomy. Patient reports she was on thorazine during at least one of these hospitalizations.    Risk to Self:  NO Risk to Others:NO   Prior Inpatient Therapy: YES  Prior Outpatient Therapy:  NO  Past Medical History:  Past Medical History:  Diagnosis Date   STEMI (ST elevation myocardial infarction) ([place]) 09/25/2020   Tobacco abuse stopped 5 yrs ago     Family History:  Family History  Problem Relation Age of Onset   Hypertension Mother    Family Psychiatric  History: Sister also had mood disorder that coincided with menstruation.  Social History:  Social History   Substance and Sexual Activity  Alcohol Use Not Currently     Social History   Substance and Sexual Activity  Drug Use Not Currently  Social History   Socioeconomic History   Marital status: Married    Spouse name: Not on file   Number of children: Not on file   Years of education: Not on file   Highest education level: Not on file  Occupational History   Not on file  Tobacco Use   Smoking status: Former    Years: 45.00    Pack years: 0.00    Types: Cigarettes     Quit date: 04/2015    Years since quitting: 5.4   Smokeless tobacco: Never  Substance and Sexual Activity   Alcohol use: Not Currently   Drug use: Not Currently   Sexual activity: Yes  Other Topics Concern   Not on file  Social History Narrative   Not on file   Social Determinants of Health   Financial Resource Strain: Not on file  Food Insecurity: Not on file  Transportation Needs: Not on file  Physical Activity: Not on file  Stress: Not on file  Social Connections: Not on file   Additional Social History:    Allergies:   Allergies  Allergen Reactions   Latex Swelling   Cashew Nut Oil Itching and Rash   Tomato Itching and Rash    Labs:  Results for orders placed or performed during the hospital encounter of 09/25/20 (from the past 48 hour(s))  Vitamin B12     Status: None   Collection Time: 09/29/20  4:08 PM  Result Value Ref Range   Vitamin B-12 841 180 - 914 pg/mL    Comment: (NOTE) This assay is not validated for testing neonatal or myeloproliferative syndrome specimens for Vitamin B12 levels. Performed at [facility], [place] [address], [place], [place] [address]   HIV Antibody (routine testing w rflx)     Status: None   Collection Time: 09/29/20  4:08 PM  Result Value Ref Range   HIV Screen 4th Generation wRfx Non Reactive Non Reactive    Comment: Performed at [facility], [place] [address] [place], [place] [address]  CBC     Status: None   Collection Time: 09/30/20  3:41 AM  Result Value Ref Range   WBC 6.6 4.0 - 10.5 K/uL   RBC 4.32 3.87 - 5.11 MIL/uL   Hemoglobin 12.7 12.0 - 15.0 g/dL   HCT 39.1 36.0 - 46.0 %   MCV 90.5 80.0 - 100.0 fL   MCH 29.4 26.0 - 34.0 pg   MCHC 32.5 30.0 - 36.0 g/dL   RDW 12.3 11.5 - 15.5 %   Platelets 338 150 - 400 K/uL   nRBC 0.0 0.0 - 0.2 %    Comment: Performed at [facility], [place] [address], [place], [place] [address]  Comprehensive metabolic panel     Status: Abnormal   Collection Time: 09/30/20   3:41 AM  Result Value Ref Range   Sodium 136 135 - 145 mmol/L   Potassium 3.5 3.5 - 5.1 mmol/L   Chloride 101 98 - 111 mmol/L   CO2 26 22 - 32 mmol/L   Glucose, Bld 239 (H) 70 - 99 mg/dL    Comment: Glucose reference range applies only to samples taken after fasting for at least 8 hours.   BUN 12 8 - 23 mg/dL   Creatinine, Ser 0.91 0.44 - 1.00 mg/dL   Calcium 9.7 8.9 - 10.3 mg/dL   Total Protein 7.7 6.5 - 8.1 g/dL   Albumin 3.5 3.5 - 5.0 g/dL   AST  21 15 - 41 U/L   ALT 16 0 - 44 U/L   Alkaline Phosphatase 64 38 - 126 U/L   Total Bilirubin 0.5 0.3 - 1.2 mg/dL   GFR, Estimated >60 >60 mL/min    Comment: (NOTE) Calculated using the CKD-EPI Creatinine Equation (2021)    Anion gap 9 5 - 15    Comment: Performed at [facility] [address] [place], [place] [address]  Glucose, capillary     Status: Abnormal   Collection Time: 09/30/20  9:44 AM  Result Value Ref Range   Glucose-Capillary 209 (H) 70 - 99 mg/dL    Comment: Glucose reference range applies only to samples taken after fasting for at least 8 hours.    Current Facility-Administered Medications  Medication Dose Route Frequency Provider Last Rate Last Admin   0.9 %  sodium chloride infusion  250 mL Intravenous PRN Burnell Blanks, MD       acetaminophen (TYLENOL) tablet 650 mg  650 mg Oral Q4H PRN Isaiah Serge, NP   650 mg at 09/26/20 0158   ALPRAZolam (XANAX) tablet 0.25 mg  0.25 mg Oral BID PRN Isaiah Serge, NP       aspirin EC tablet 81 mg  81 mg Oral Daily Isaiah Serge, NP   81 mg at 09/30/20 0954   atorvastatin (LIPITOR) tablet 80 mg  80 mg Oral Daily Isaiah Serge, NP   80 mg at 09/30/20 4081   Chlorhexidine Gluconate Cloth 2 % PADS 6 each  6 each Topical Daily Burnell Blanks, MD   6 each at 09/30/20 0955   colchicine tablet 0.6 mg  0.6 mg Oral BID Darreld Mclean, PA-C   0.6 mg at 09/30/20 4481   dapagliflozin propanediol (FARXIGA) tablet 10 mg  10 mg Oral Daily Freada Bergeron, MD   10 mg at 09/30/20 0954   hydrOXYzine (ATARAX/VISTARIL) tablet 25 mg  25 mg Oral TID PRN Damita Dunnings B, MD       insulin aspart (novoLOG) injection 0-5 Units  0-5 Units Subcutaneous QHS Darrick Meigs, Marge Duncans, MD       insulin aspart (novoLOG) injection 0-9 Units  0-9 Units Subcutaneous TID WC Oswald Hillock, MD   3 Units at 09/30/20 8563   insulin glargine (LANTUS) injection 20 Units  20 Units Subcutaneous QHS Darrick Meigs, Marge Duncans, MD       insulin starter kit- pen needles (English) 1 kit  1 kit Other Once Burnell Blanks, MD       isosorbide mononitrate (IMDUR) 24 hr tablet 30 mg  30 mg Oral Daily Burnell Blanks, MD   30 mg at 09/30/20 1497   living well with diabetes book MISC   Does not apply Once Burnell Blanks, MD       loperamide (IMODIUM) capsule 2 mg  2 mg Oral PRN Burnell Blanks, MD   2 mg at 09/29/20 2111   metoprolol succinate (TOPROL-XL) 24 hr tablet 12.5 mg  12.5 mg Oral Daily Freada Bergeron, MD   12.5 mg at 09/30/20 0954   morphine 2 MG/ML injection 2 mg  2 mg Intravenous Q1H PRN Burnell Blanks, MD   2 mg at 09/29/20 1243   nitroGLYCERIN (NITROSTAT) SL tablet 0.4 mg  0.4 mg Sublingual Q5 Min x 3 PRN Cecilie Kicks R, NP   0.4 mg at 09/28/20 1816   ondansetron (ZOFRAN) injection 4 mg  4 mg Intravenous Q6H  PRN Isaiah Serge, NP       oxyCODONE (Oxy IR/ROXICODONE) immediate release tablet 5-10 mg  5-10 mg Oral Q4H PRN Burnell Blanks, MD   10 mg at 09/29/20 2115   sodium chloride flush (NS) 0.9 % injection 3 mL  3 mL Intravenous Q12H Burnell Blanks, MD   3 mL at 09/30/20 0955   sodium chloride flush (NS) 0.9 % injection 3 mL  3 mL Intravenous PRN Burnell Blanks, MD       spironolactone (ALDACTONE) tablet 12.5 mg  12.5 mg Oral Daily Freada Bergeron, MD   12.5 mg at 09/30/20 4431    Musculoskeletal: Strength & Muscle Tone: within normal limits Gait & Station:  remains in bed Patient leans:  N/A            Psychiatric Specialty Exam:  Presentation  General Appearance: Appropriate for Environment  Eye Contact:Good  Speech:Clear and Coherent  Speech Volume:Normal  Handedness: No data recorded  Mood and Affect  Mood:Anxious  Affect:Congruent   Thought Process  Thought Processes:Coherent  Descriptions of Associations:Circumstantial  Orientation:Full (Time, Place and Person)  Thought Content:WDL  History of Schizophrenia/Schizoaffective disorder:No data recorded Duration of Psychotic Symptoms:No data recorded Hallucinations:Hallucinations: None  Ideas of Reference:None  Suicidal Thoughts:Suicidal Thoughts: No  Homicidal Thoughts:Homicidal Thoughts: No   Sensorium  Memory:Immediate Good; Recent Good; Remote Good  Judgment:Fair  Insight:None   Executive Functions  Concentration:Fair  Attention Span:Good  Cheval of Knowledge:Poor  Language:Good   Psychomotor Activity  Psychomotor Activity:Psychomotor Activity: Normal   Assets  Assets:Communication Skills; Housing; Resilience   Sleep  Sleep:Sleep: Fair   Physical Exam: Physical Exam Constitutional:      Appearance: Normal appearance.  HENT:     Head: Normocephalic and atraumatic.  Eyes:     Extraocular Movements: Extraocular movements intact.     Conjunctiva/sclera: Conjunctivae normal.  Cardiovascular:     Rate and Rhythm: Normal rate.  Pulmonary:     Effort: Pulmonary effort is normal.     Breath sounds: Normal breath sounds.  Abdominal:     General: Abdomen is flat.  Musculoskeletal:        General: Normal range of motion.  Skin:    General: Skin is warm.     Findings: No rash.  Neurological:     Mental Status: She is alert. Mental status is at baseline.   Review of Systems  Constitutional:  Negative for chills and fever.  HENT:  Negative for hearing loss.   Eyes:  Negative for blurred vision.  Respiratory:  Negative for cough and  wheezing.   Cardiovascular:  Negative for chest pain.  Gastrointestinal:  Negative for abdominal pain.  Neurological:  Negative for dizziness.  Psychiatric/Behavioral:  Negative for hallucinations and suicidal ideas.   Blood pressure (!) 160/85, pulse 87, temperature 98.7 F (37.1 C), temperature source Oral, resp. rate 15, height _0  (1.727 m), weight 88.8 kg, SpO2 97 %. Body mass index is 29.76 kg/m.  Treatment Plan Summary: Daily contact with patient to assess and evaluate symptoms and progress in treatment Depression 2/2 chronic medical illness Patient reports that she is depressed because of her health and MRI noted patient has several microhemorraghes that appear 2/2 HTN. Patient has decided not to start Zoloft; however she is willing to start PRN medication for anxiety and has decided that she will try to care for her personal health.  - Start Atarax 51m TID PRN for anxiety related to phobia of needles  Psych will continue to follow and patient's mental health as well as poor understanding of her health and mistrust of the healthcare system are significantly impacting patient's health.  Disposition: Supportive therapy provided about ongoing stressors.  PGY-1 Freida Busman, MD 09/30/2020 11:12 AM

## 2020-09-30 NOTE — Progress Notes (Signed)
Progress Note  Patient Name: Sabrina Ellis Date of Encounter: 09/30/2020  [facility] Cardiologist: Verne Carrow, MD   Subjective   Evaluated by CV surgery with final recommendations pending Refusing finger sticks for glucose monitoring overnight  Comfortable this morning. Denies chest pain or SOB. Does not remember our conversation from yesterday but has it written down in her book.  Inpatient Medications    Scheduled Meds:  aspirin EC  81 mg Oral Daily   atorvastatin  80 mg Oral Daily   Chlorhexidine Gluconate Cloth  6 each Topical Daily   colchicine  0.6 mg Oral BID   dapagliflozin propanediol  10 mg Oral Daily   insulin aspart  0-5 Units Subcutaneous QHS   insulin aspart  0-9 Units Subcutaneous TID WC   insulin glargine  20 Units Subcutaneous QHS   isosorbide mononitrate  30 mg Oral Daily   metoprolol succinate  12.5 mg Oral Daily   sodium chloride flush  3 mL Intravenous Q12H   spironolactone  12.5 mg Oral Daily   Continuous Infusions:  sodium chloride     PRN Meds: sodium chloride, acetaminophen, ALPRAZolam, loperamide, morphine injection, nitroGLYCERIN, ondansetron (ZOFRAN) IV, oxyCODONE, sodium chloride flush   Vital Signs    Vitals:   09/29/20 0800 09/29/20 1100 09/29/20 1922 09/30/20 0315  BP: 110/80 115/89 (!) 162/82 (!) 160/85  Pulse: 68 79 91 87  Resp: 18 18 16 15   Temp: 98.1 F (36.7 C) 98 F (36.7 C) 99.6 F (37.6 C) 98.7 F (37.1 C)  TempSrc: Oral Oral Oral Oral  SpO2: 95% 96% 93% 97%  Weight:    88.8 kg  Height:        Intake/Output Summary (Last 24 hours) at 09/30/2020 0815 Last data filed at 09/30/2020 0331 Gross per 24 hour  Intake 360 ml  Output --  Net 360 ml   Last 3 Weights 09/30/2020 09/29/2020 09/28/2020  Weight (lbs) 195 lb 11.2 oz 199 lb 1.6 oz 200 lb 6.4 oz  Weight (kg) 88.769 kg 90.311 kg 90.9 kg      Telemetry    NSR - Personally Reviewed  ECG    No new tracing - Personally Reviewed  Physical Exam    GEN: No acute distress.   Neck: No JVD Cardiac: RRR, no murmurs, rubs, or gallops.  Respiratory: Clear to auscultation bilaterally. GI: Soft, nontender, non-distended  MS: No edema; No deformity. Neuro:  AAOx3, does not remember our conversation from yesterday Psych: Normal affect   Labs    High Sensitivity Troponin:   Recent Labs  Lab 09/25/20 1709 09/25/20 2006 09/26/20 0243  TROPONINIHS 9,554* 11,565* 11,491*      Chemistry Recent Labs  Lab 09/25/20 1709 09/26/20 0243 09/30/20 0341  NA 133* 131* 136  K 3.3* 4.1 3.5  CL 96* 97* 101  CO2 24 25 26   GLUCOSE 477* 433* 239*  BUN 9 7* 12  CREATININE 0.85 0.73 0.91  CALCIUM 9.7 9.0 9.7  PROT 7.8  --  7.7  ALBUMIN 3.7  --  3.5  AST 25  --  21  ALT 15  --  16  ALKPHOS 68  --  64  BILITOT 0.7  --  0.5  GFRNONAA >60 >60 >60  ANIONGAP 13 9 9      Hematology Recent Labs  Lab 09/25/20 1709 09/26/20 0243 09/30/20 0341  WBC 7.2  7.2 7.2 6.6  RBC 4.76  4.71 4.18 4.32  HGB 14.0  14.0 12.4 12.7  HCT 42.2  41.7 37.2 39.1  MCV 88.7  88.5 89.0 90.5  MCH 29.4  29.7 29.7 29.4  MCHC 33.2  33.6 33.3 32.5  RDW 12.1  12.1 12.4 12.3  PLT 284  280 267 338    BNPNo results for input(s): BNP, PROBNP in the last 168 hours.   DDimer No results for input(s): DDIMER in the last 168 hours.   Radiology    MR BRAIN WO CONTRAST  Result Date: 09/29/2020 CLINICAL DATA:  Memory loss EXAM: MRI HEAD WITHOUT CONTRAST TECHNIQUE: Multiplanar, multiecho pulse sequences of the brain and surrounding structures were obtained without intravenous contrast. COMPARISON:  None. FINDINGS: Brain: Small foci of abnormal diffusion restriction within both cerebellar hemispheres and in the right parietal lobe. Single focus of chronic microhemorrhage in the right temporal lobe. There is multifocal hyperintense T2-weighted signal within the white matter. Parenchymal volume and CSF spaces are normal. The midline structures are normal. Vascular:  Major flow voids are preserved. Skull and upper cervical spine: Normal calvarium and skull base. Visualized upper cervical spine and soft tissues are normal. Sinuses/Orbits:No paranasal sinus fluid levels or advanced mucosal thickening. No mastoid or middle ear effusion. Normal orbits. IMPRESSION: 1. Small foci of acute ischemia within both cerebellar hemispheres and right parietal lobe. No hemorrhage or mass effect. 2. Findings of chronic microvascular ischemia. Electronically Signed   By: Deatra Robinson M.D.   On: 09/29/2020 23:01    Cardiac Studies   Echo 09/26/2020  1. Poor quality images even with definity Markedly abnormal septal motion  inferior wall hypokinesis . Left ventricular ejection fraction, by  estimation, is 40 to 45%. The left ventricle has mildly decreased  function. The left ventricle demonstrates  regional wall motion abnormalities (see scoring diagram/findings for  description). Left ventricular diastolic parameters were normal.   2. Right ventricular systolic function is normal. The right ventricular  size is normal.   3. Right atrial size was mildly dilated.   4. The mitral valve is abnormal. Trivial mitral valve regurgitation. No  evidence of mitral stenosis.   5. The aortic valve is tricuspid. There is mild calcification of the  aortic valve. Aortic valve regurgitation is not visualized. Mild aortic  valve sclerosis is present, with no evidence of aortic valve stenosis.   6. The inferior vena cava is normal in size with greater than 50%  respiratory variability, suggesting right atrial pressure of 3 mmHg.   Cath 09/25/20 CORONARY STENT INTERVENTION  LEFT HEART CATH AND CORONARY ANGIOGRAPHY      Conclusion     Mid Cx lesion is 80% stenosed. Prox LAD lesion is 70% stenosed. Ost RCA to Dist RCA lesion is 100% stenosed.   1. Late presenting inferior ST elevation MI secondary to occlusion of the large, dominant RCA in the proximal segment. Unsuccessful attempt at  PCI. There are left to right collaterals filling the distal RCA branches. 2. Moderately severe proximal LAD stenosis 3. Severe mid Circumflex stenosis   Recommendations: She is pain free. Unable to perform PCI of the RCA as I could not pass a wire beyond the totally occluded proximal vessel. She is felt to have a late presenting inferior MI. Given three vessel CAD, will consider CABG as an option for revascularization. I will admit her to the ICU. Continue ASA/Brilinta for now. Will start a high intensity statin. Start a beta blocker. Echo in the am. CT surgery consultation this weekend to consider CABG. We will call for consultation tomorrow and if she is felt to be  a good candidate for bypass, would stop the Brilinta and start IV heparin. All labs pending at this time.   Diagnostic Dominance: Right        Patient Profile     65 y.o. female with PMH of poorly controlled DMII who presented with a STEMI found to have occluded large, dominant RCA in the proximal segment with attempted but unsuccessful PCI, moderate to severe proximal LAD stenosis, severe mid circumflex stenosis.  Given multivessel, severe CAD the patient was recommended for CABG. Due to initial decision to be DNR; CABG evaluation deferred however, patient has decided she would like to change code status to limited resuscitation and is willing to be full code for the procedure. Overall, difficult situation as patient is suffering from memory issues, has poorly controlled DMII and refusing blood glucose checks and is very hesitant with medical care. CV surgery is consulted. On medical management currently.  Assessment & Plan    #Late Presentation of Inferior STEMI: #Multivessel CAD: Patient presented with 2-63month history of worsening chest pain that acutely worsened about 1 week prior to admission. Underwent coronary angiography as detailed above found to have multivessel CAD not amenable to PCI. Has been recommended for CABG, but  has been difficult to navigate due to patient's initial code status of DNR which has subsequently been clarified to limited resuscitation. Notably, the patient suffers from memory issues, has poorly controlled DM which she is reluctant to have closely monitored and refusing finger sticks, and lacks insight into the recovery that is required for CABG. CV surgery evaluted with final recommendations pending. Psych also following. -CV surgery consulted -Holding plavix for now -Continue ASA 81mg  daily -Continue lipitor 80mg  daily -Continue metop 12.5mg  XL daily -Will add entresto in future pending decision for surgery  #Ischemic CM: LVEF 40-45% with septal and inferior wall hypokinesis in the setting of multivessel CAD and inferior STEMI as above. Clinically euvolemic and compensated. Currently undergoing evaluation for revascularization vs medical management. -Not on diuretics, appears compensated -Continue metop 12.5mg  XL daily -Continue spironolactone 12.5mg  daily -Continue farxiga 10mg  daily -Will add entresto as able as above -Manage multivessel CAD as above  #Possible pericarditis: Pleuritic chest pain during hospitalization. Now improved with colchicine. -Continue colchicine  #Uncontrolled DMII: A1C 14. Difficult situation to manage as patient refusing blood draws.  -Agree with DMII education -Management per IM  #Memory Difficulties: Has significant short term memory loss and does not remember our lengthy conversation from yesterday. -Psych consulted, appreciate recommendations -Agree with MRI head  #HLD: -Continue lipitor 80mg  daily  For questions or updates, please contact [facility] Please consult [email or web address] for contact info under   Total time of encounter: 50 minutes total time of encounter, including 40 minutes spent in face-to-face patient care on the date of this encounter. This time includes coordination of care and counseling regarding above mentioned problem  list. Remainder of non-face-to-face time involved reviewing chart documents/testing relevant to the patient encounter and documentation in the medical record. I have independently reviewed documentation from referring provider.    Signed, , MD  09/30/2020, 8:15 AM

## 2020-10-01 ENCOUNTER — Inpatient Hospital Stay (HOSPITAL_COMMUNITY)

## 2020-10-01 ENCOUNTER — Encounter (HOSPITAL_COMMUNITY): Payer: Self-pay | Admitting: Cardiovascular Disease

## 2020-10-01 DIAGNOSIS — E0800 Diabetes mellitus due to underlying condition with hyperosmolarity without nonketotic hyperglycemic-hyperosmolar coma (NKHHC): Secondary | ICD-10-CM

## 2020-10-01 DIAGNOSIS — Z7189 Other specified counseling: Secondary | ICD-10-CM

## 2020-10-01 DIAGNOSIS — I6330 Cerebral infarction due to thrombosis of unspecified cerebral artery: Secondary | ICD-10-CM

## 2020-10-01 DIAGNOSIS — I2119 ST elevation (STEMI) myocardial infarction involving other coronary artery of inferior wall: Secondary | ICD-10-CM | POA: Diagnosis not present

## 2020-10-01 DIAGNOSIS — I6340 Cerebral infarction due to embolism of unspecified cerebral artery: Secondary | ICD-10-CM | POA: Diagnosis not present

## 2020-10-01 DIAGNOSIS — E7800 Pure hypercholesterolemia, unspecified: Secondary | ICD-10-CM

## 2020-10-01 DIAGNOSIS — I2111 ST elevation (STEMI) myocardial infarction involving right coronary artery: Secondary | ICD-10-CM | POA: Diagnosis not present

## 2020-10-01 DIAGNOSIS — I5023 Acute on chronic systolic (congestive) heart failure: Secondary | ICD-10-CM | POA: Diagnosis not present

## 2020-10-01 LAB — GLUCOSE, CAPILLARY
Glucose-Capillary: 168 mg/dL — ABNORMAL HIGH (ref 70–99)
Glucose-Capillary: 215 mg/dL — ABNORMAL HIGH (ref 70–99)
Glucose-Capillary: 170 mg/dL — ABNORMAL HIGH (ref 70–99)
Glucose-Capillary: 171 mg/dL — ABNORMAL HIGH (ref 70–99)

## 2020-10-01 MED ORDER — IOHEXOL 350 MG/ML SOLN
50.0000 mL | Freq: Once | INTRAVENOUS | Status: AC | PRN
  Administered 2020-10-01: 50 mL via INTRAVENOUS

## 2020-10-01 NOTE — Plan of Care (Addendum)
  RD consulted for nutrition education regarding diabetes.   Lab Results  Component Value Date   HGBA1C 14.2 (H) 09/25/2020   Spoke with pt at bedside. She describes herself as a very spiritual person and credits her faith with being able to handle her most recent health challenges. Pt shared with this RD her mistrust with the healthcare system due to multiple poor past experiences. However, she feels comfortable with the care she is receiving here.   Per pt, she was able to use her faith as a tool to help her overcome her fear of needles and has been taking her insulin and CBGS without difficulty. RD praised pt for this and discussed importance of self-management to best manage her DM. RD encouraged continued learning and practice to help optimize her health.   Pt was very tangential at time of visit and difficult to redirect at times. Pt has short term memory loss and uses a notebook to help her remember her healthcare team and what was discussed. With pt's permission, RD drew plate method in pt's notebook to assist her with meal planning and went over this with her. Also added carb modified diet to pt's current diet orders, as she expressed frustration of hyperglycemia and receiving foods that would elevate her blood sugar (lunch tray delivered had broccoli, sweet potato, orange sherbet, and gingerale). Pt also does not eat pork- preferences added into [employer] meal ordering system.   Also provided referral to [facility] for further outpatient support. Pt will require a lot of education, support, and reinforcement.   RD provided "Carbohydrate Counting for People with Diabetes" handout from the Academy of [employer] and Dietetics. Discussed different food groups and their effects on blood sugar, emphasizing carbohydrate-containing foods. Provided list of carbohydrates and recommended serving sizes of common foods.  Discussed importance of controlled  and consistent carbohydrate intake throughout the day. Provided examples of ways to balance meals/snacks and encouraged intake of high-fiber, whole grain complex carbohydrates. Teach back method used.  Expect fair compliance.  Current diet order is Heart Healthy, patient is consuming approximately 75-100% of meals at this time. Labs and medications reviewed. No further nutrition interventions warranted at this time. RD contact information provided. If additional nutrition issues arise, please re-consult RD.  Levada Schilling, RD, LDN, CDCES Registered Dietitian II Certified Diabetes Care and Education Specialist Please refer to [facility] for RD and/or RD on-call/weekend/after hours pager

## 2020-10-01 NOTE — Progress Notes (Signed)
[facility] PROGRESS NOTE    Sabrina Ellis  LFY:[id number] DOB: [birth year] DOA: 09/25/2020 PCP: Pcp, No     Brief Narrative:  Sabrina Ellis is a 65 year old female with history of diabetes mellitus type 2, who does not follow with PCP, who came to hospital with episodic chest pain for past 2 to 3 months.  She was taken to [facility] where EKG showed ST elevation in leads 2, 3, aVF.  She was brought to [facility] for evaluation for cardiac catheterization. Cardiac cath showed inferior STEMI secondary to occlusion of large, dominant RCA proximally.  PCI was unsuccessful.  Patient also was found to have moderate to severe proximal LAD stenosis.  CABG was recommended.  Hospitalization further complicated by patient's underlying psych issues, memory loss.  Due to her memory loss, MRI brain was completed which revealed acute ischemia.  Neurology consulted for stroke work-up.  New events last 24 hours / Subjective: Patient states that her chest pain continues to improve, it is the best that it has felt in a while.  No shortness of breath. Patient is able to verbalize to me the plan for today including echocardiogram and CTA of the neck.  She is unsure why it is taking so long to get living will paperwork completed.  She was initially DNR, then was changed to full code by cardiology team (listed as partial code, patient would like all resuscitation efforts performed but not to be maintained on machines for prolonged period of time).  She is still wearing her purple DNR bracelet.  I inquired about this, that the DNR bracelet goes against her wishes listed in the chart.  She states that she is full code, but does not want to remove the bracelet until living will paperwork is completed.  She does have palliative care consultation pending, hopefully they will be able to assist with filling out MOST paperwork as well.  Assessment & Plan:   Active Problems:   STEMI (ST elevation myocardial  infarction) (HCC)   Acute ST elevation myocardial infarction (STEMI) of inferior wall (HCC)   Cerebral thrombosis with cerebral infarction   Cerebral embolism with cerebral infarction   STEMI -Cardiac cath showed occluded large dominant RCA in the proximal segment, unsuccessful attempted PCI -Also showed moderate to severe proximal LAD stenosis, severe mid circumflex stenosis -Cardiology, cardiothoracic surgery following -Continue aspirin, Lipitor, metoprolol -CABG work-up with tentative plans for OR Monday   Ischemic cardiomyopathy -Continue metoprolol, spironolactone, Farxiga, imdur   CVA -MRI brain: Small foci of acute ischemia within both cerebellar hemispheres and right parietal lobe. No hemorrhage or mass effect. Findings of chronic microvascular ischemia. -Neurology following -Echocardiogram with bubble study pending -CTA head and neck pending -Continue Lipitor -Continue aspirin  Short term memory loss -RPR NR -HIV NR -Vitamin B12 within normal  Depression/anxiety  -Psych following -Started on atarax   DM 2, uncontrolled with hyperglycemia -Continue Lantus, sliding scale insulin -Diabetic coordinator consult  Hyperlipidemia -Continue Lipitor   DVT prophylaxis:  Place and maintain sequential compression device Start: 09/29/20 1555 SCD's Start: 09/25/20 1836  Code Status:     Code Status Orders  (From admission, onward)           Start     Ordered   09/29/20 1302  Limited resuscitation (code)  Continuous       Question Answer Comment  In the event of cardiac or respiratory ARREST: Initiate Code Blue, Call Rapid Response Yes   In the event of cardiac or respiratory ARREST:  Perform CPR Yes   In the event of cardiac or respiratory ARREST: Perform Intubation/Mechanical Ventilation Yes   In the event of cardiac or respiratory ARREST: Use NIPPV/BiPAp only if indicated Yes   In the event of cardiac or respiratory ARREST: Administer ACLS medications if  indicated Yes   In the event of cardiac or respiratory ARREST: Perform Defibrillation or Cardioversion if indicated Yes   Comments Patient would like all resucitation efforts performed but would not like to be maintained on machines for a prolonged period of time or prolonged resuscitation if deemed unlikely to have meaningful recovery      09/29/20 1303           Code Status History     Date Active Date Inactive Code Status Order ID Comments User Context   09/25/2020 2252 09/29/2020 1301 DNR [id number]  Marykay Lex, MD Inpatient   09/25/2020 1839 09/25/2020 2252 Full Code [id number]  Isaiah Serge, NP Inpatient   09/25/2020 1839 09/25/2020 1839 Full Code [id number]  Burnell Blanks, MD Inpatient      Family Communication: None at bedside  Disposition Plan:  Status is: Inpatient  Remains inpatient appropriate because:Ongoing diagnostic testing needed not appropriate for outpatient work up  Dispo: The patient is from: Home              Anticipated d/c is to: Home              Patient currently is not medically stable to d/c.   Difficult to place patient No      Antimicrobials:  Anti-infectives (From admission, onward)    None        Objective: Vitals:   10/01/20 0450 10/01/20 0500 10/01/20 0751 10/01/20 1129  BP: 126/80  126/69 139/84  Pulse: 78  83 86  Resp: 18     Temp: 98.1 F (36.7 C)  98 F (36.7 C)   TempSrc: Oral  Oral   SpO2: 99%  99% 100%  Weight:  88.9 kg    Height:        Intake/Output Summary (Last 24 hours) at 10/01/2020 1248 Last data filed at 10/01/2020 1100 Gross per 24 hour  Intake 1074 ml  Output --  Net 1074 ml    Filed Weights   09/29/20 0316 09/30/20 0315 10/01/20 0500  Weight: 90.3 kg 88.8 kg 88.9 kg   Examination: General exam: Appears calm and comfortable  Respiratory system: Clear to auscultation. Respiratory effort normal. Cardiovascular system: S1 & S2 heard, RRR. No pedal edema. Gastrointestinal system: Abdomen is  nondistended, soft and nontender. Normal bowel sounds heard. Central nervous system: Alert and oriented. Non focal exam. Speech clear  Extremities: Symmetric in appearance bilaterally  Skin: No rashes, lesions or ulcers on exposed skin  Psychiatry: Mood & affect appropriate.    Data Reviewed: I have personally reviewed following labs and imaging studies  CBC: Recent Labs  Lab 09/25/20 1702 09/25/20 1709 09/26/20 0243 09/30/20 0341  WBC  --  7.2  7.2 7.2 6.6  NEUTROABS  --  4.0  --   --   HGB 14.6 14.0  14.0 12.4 12.7  HCT 43.0 42.2  41.7 37.2 39.1  MCV  --  88.7  88.5 89.0 90.5  PLT  --  284  280 267 329    Basic Metabolic Panel: Recent Labs  Lab 09/25/20 1702 09/25/20 1709 09/25/20 1823 09/26/20 0243 09/30/20 0341  NA 133* 133*  --  131* 136  K 3.8 3.3*  --  4.1 3.5  CL 95* 96*  --  97* 101  CO2  --  24  --  25 26  GLUCOSE 562* 477*  --  433* 239*  BUN 10 9  --  7* 12  CREATININE 0.70 0.85  --  0.73 0.91  CALCIUM  --  9.7  --  9.0 9.7  MG  --   --  2.1  --   --     GFR: Estimated Creatinine Clearance: 72.9 mL/min (by C-G formula based on SCr of 0.91 mg/dL). Liver Function Tests: Recent Labs  Lab 09/25/20 1709 09/30/20 0341  AST 25 21  ALT 15 16  ALKPHOS 68 64  BILITOT 0.7 0.5  PROT 7.8 7.7  ALBUMIN 3.7 3.5    No results for input(s): LIPASE, AMYLASE in the last 168 hours. No results for input(s): AMMONIA in the last 168 hours. Coagulation Profile: Recent Labs  Lab 09/25/20 2152  INR 1.1    Cardiac Enzymes: No results for input(s): CKTOTAL, CKMB, CKMBINDEX, TROPONINI in the last 168 hours. BNP (last 3 results) No results for input(s): PROBNP in the last 8760 hours. HbA1C: No results for input(s): HGBA1C in the last 72 hours. CBG: Recent Labs  Lab 09/30/20 1119 09/30/20 1730 09/30/20 2130 10/01/20 0538 10/01/20 1128  GLUCAP 189* 176* 249* 168* 215*    Lipid Profile: No results for input(s): CHOL, HDL, LDLCALC, TRIG, CHOLHDL,  LDLDIRECT in the last 72 hours. Thyroid Function Tests: No results for input(s): TSH, T4TOTAL, FREET4, T3FREE, THYROIDAB in the last 72 hours. Anemia Panel: Recent Labs    09/29/20 1608  VITAMINB12 841    Sepsis Labs: No results for input(s): PROCALCITON, LATICACIDVEN in the last 168 hours.  Recent Results (from the past 240 hour(s))  Resp Panel by RT-PCR (Flu A&B, Covid) Nasopharyngeal Swab     Status: None   Collection Time: 09/25/20  4:43 PM   Specimen: Nasopharyngeal Swab; Nasopharyngeal(NP) swabs in vial transport medium  Result Value Ref Range Status   SARS Coronavirus 2 by RT PCR NEGATIVE NEGATIVE Final    Comment: (NOTE) SARS-CoV-2 target nucleic acids are NOT DETECTED.  The SARS-CoV-2 RNA is generally detectable in upper respiratory specimens during the acute phase of infection. The lowest concentration of SARS-CoV-2 viral copies this assay can detect is 138 copies/mL. A negative result does not preclude SARS-Cov-2 infection and should not be used as the sole basis for treatment or other patient management decisions. A negative result may occur with  improper specimen collection/handling, submission of specimen other than nasopharyngeal swab, presence of viral mutation(s) within the areas targeted by this assay, and inadequate number of viral copies(<138 copies/mL). A negative result must be combined with clinical observations, patient history, and epidemiological information. The expected result is Negative.  Fact Sheet for Patients:  EntrepreneurPulse.com.au  Fact Sheet for Healthcare Providers:  IncredibleEmployment.be  This test is no t yet approved or cleared by the [place] FDA and  has been authorized for detection and/or diagnosis of SARS-CoV-2 by FDA under an Emergency Use Authorization (EUA). This EUA will remain  in effect (meaning this test can be used) for the duration of the COVID-19 declaration under Section  564(b)(1) of the Act, 21 U.S.C.section 360bbb-3(b)(1), unless the authorization is terminated  or revoked sooner.       Influenza A by PCR NEGATIVE NEGATIVE Final   Influenza B by PCR NEGATIVE NEGATIVE Final    Comment: (NOTE) The Xpert Xpress SARS-CoV-2/FLU/RSV plus assay is intended as an  aid in the diagnosis of influenza from Nasopharyngeal swab specimens and should not be used as a sole basis for treatment. Nasal washings and aspirates are unacceptable for Xpert Xpress SARS-CoV-2/FLU/RSV testing.  Fact Sheet for Patients: EntrepreneurPulse.com.au  Fact Sheet for Healthcare Providers: IncredibleEmployment.be  This test is not yet approved or cleared by the [place] FDA and has been authorized for detection and/or diagnosis of SARS-CoV-2 by FDA under an Emergency Use Authorization (EUA). This EUA will remain in effect (meaning this test can be used) for the duration of the COVID-19 declaration under Section 564(b)(1) of the Act, 21 U.S.C. section 360bbb-3(b)(1), unless the authorization is terminated or revoked.  Performed at [facility], [place] [address], [place], [place] [address]   MRSA PCR Screening     Status: None   Collection Time: 09/25/20  6:00 PM   Specimen: Nasopharyngeal  Result Value Ref Range Status   MRSA by PCR NEGATIVE NEGATIVE Final    Comment:        The GeneXpert MRSA Assay (FDA approved for NASAL specimens only), is one component of a comprehensive MRSA colonization surveillance program. It is not intended to diagnose MRSA infection nor to guide or monitor treatment for MRSA infections. Performed at [facility], [place] [address], [place], [place] [address]        Radiology Studies: MR BRAIN WO CONTRAST  Result Date: 09/29/2020 CLINICAL DATA:  Memory loss EXAM: MRI HEAD WITHOUT CONTRAST TECHNIQUE: Multiplanar, multiecho pulse sequences of the brain and surrounding structures were obtained  without intravenous contrast. COMPARISON:  None. FINDINGS: Brain: Small foci of abnormal diffusion restriction within both cerebellar hemispheres and in the right parietal lobe. Single focus of chronic microhemorrhage in the right temporal lobe. There is multifocal hyperintense T2-weighted signal within the white matter. Parenchymal volume and CSF spaces are normal. The midline structures are normal. Vascular: Major flow voids are preserved. Skull and upper cervical spine: Normal calvarium and skull base. Visualized upper cervical spine and soft tissues are normal. Sinuses/Orbits:No paranasal sinus fluid levels or advanced mucosal thickening. No mastoid or middle ear effusion. Normal orbits. IMPRESSION: 1. Small foci of acute ischemia within both cerebellar hemispheres and right parietal lobe. No hemorrhage or mass effect. 2. Findings of chronic microvascular ischemia. Electronically Signed   By: Ulyses Jarred M.D.   On: 09/29/2020 23:01      Scheduled Meds:  aspirin EC  81 mg Oral Daily   atorvastatin  80 mg Oral Daily   Chlorhexidine Gluconate Cloth  6 each Topical Daily   colchicine  0.6 mg Oral BID   dapagliflozin propanediol  10 mg Oral Daily   insulin aspart  0-5 Units Subcutaneous QHS   insulin aspart  0-9 Units Subcutaneous TID WC   insulin glargine  20 Units Subcutaneous QHS   insulin starter kit- pen needles  1 kit Other Once   isosorbide mononitrate  30 mg Oral Daily   living well with diabetes book   Does not apply Once   metoprolol succinate  12.5 mg Oral Daily   sodium chloride flush  3 mL Intravenous Q12H   spironolactone  12.5 mg Oral Daily   Continuous Infusions:  sodium chloride       LOS: 6 days      Time spent: 20 minutes   Dessa Phi, DO [facility] 10/01/2020, 12:48 PM   Available via Epic secure chat 7am-7pm After these hours, please refer to coverage provider listed on [email or web address]

## 2020-10-01 NOTE — Discharge Instructions (Signed)

## 2020-10-01 NOTE — Progress Notes (Signed)
RN spent 45 minutes conducting teaching for diabetes, CBG's, rotation of sites, and mechanism on how to use and give injections with the insulin pen. Patient able to complete blood sugar check with RN guiding patient through the process. Patient able to administer lantus injection to abdomen with RN verbally guiding the patient. Patient verbalizes that she is afraid that she will forget to administer insulin or obtain her blood sugars at home. Patient states that her memory impairment is a concern for taking control of her diabetes. Patient had to be redirected several times during education.

## 2020-10-01 NOTE — Consult Note (Signed)
[facility] Consult   Reason for Consult:  Concern for depression Referring Physician:  Lauree Chandler, MD Patient Identification: [name] MRN:  [id number] Principal Diagnosis: <principal problem not specified> Diagnosis:  Active Problems:   STEMI (ST elevation myocardial infarction) ([place])   Acute ST elevation myocardial infarction (STEMI) of inferior wall ([place])   Cerebral thrombosis with cerebral infarction   Cerebral embolism with cerebral infarction   Total Time spent with patient: 20 minutes  Subjective:   Sabrina Ellis is a 65 y.o. female patient admitted with STEMI and concern for depression during hospitalization. Patient has been refusing some of her medications.  HPI:  Overnight patient did very well and continued to receive her insulin. Patient was still unable to inject herself, but recognizes that this is another important step and has been working via exposure therapy to achieve this. Patient reports that her goal today is to inject herself. Patient reports that she completed that writing task that provider had given her and patient has also been watching videos about self administration of insulin to help decrease her fear. Patient denies SI, HI, and AVH. Patient indicates that she is now beginning to understand why it is important for her to administer the insulin and the testing being done in hospital.   Past Psychiatric History: Patient reports 3 psych hospital stays when she was much younger and reports that they coincided with really bad menstruation but was told that her symptoms were "in my head" and she was later diagnosed with fibroids and had a hysterectomy. Patient reports she was on thorazine during at least one of these hospitalizations.    Risk to Self:  NO Risk to Others:NO   Prior Inpatient Therapy: YES  Prior Outpatient Therapy:  NO   Past Medical History:  Past Medical History:  Diagnosis Date   STEMI (ST  elevation myocardial infarction) ([place]) 09/25/2020   Tobacco abuse stopped 5 yrs ago    Family History:  Family History  Problem Relation Age of Onset   Hypertension Mother    Family Psychiatric  History: Sister also had mood disorder that coincided with menstruation.  Social History:  Social History   Substance and Sexual Activity  Alcohol Use Not Currently     Social History   Substance and Sexual Activity  Drug Use Not Currently    Social History   Socioeconomic History   Marital status: Married    Spouse name: Not on file   Number of children: Not on file   Years of education: Not on file   Highest education level: Not on file  Occupational History   Not on file  Tobacco Use   Smoking status: Former    Years: 45.00    Pack years: 0.00    Types: Cigarettes    Quit date: 04/2015    Years since quitting: 5.4   Smokeless tobacco: Never  Substance and Sexual Activity   Alcohol use: Not Currently   Drug use: Not Currently   Sexual activity: Yes  Other Topics Concern   Not on file  Social History Narrative   Not on file   Social Determinants of Health   Financial Resource Strain: Not on file  Food Insecurity: Not on file  Transportation Needs: Not on file  Physical Activity: Not on file  Stress: Not on file  Social Connections: Not on file   Additional Social History:    Allergies:   Allergies  Allergen Reactions   Latex Swelling   Cashew  Nut Oil Itching and Rash   Tomato Itching and Rash    Labs:  Results for orders placed or performed during the hospital encounter of 09/25/20 (from the past 48 hour(s))  RPR     Status: None   Collection Time: 09/29/20  4:08 PM  Result Value Ref Range   RPR Ser Ql NON REACTIVE NON REACTIVE    Comment: Performed at [facility], [address] [place], [place] [address]  Vitamin B12     Status: None   Collection Time: 09/29/20  4:08 PM  Result Value Ref Range   Vitamin B-12 841 180 - 914 pg/mL     Comment: (NOTE) This assay is not validated for testing neonatal or myeloproliferative syndrome specimens for Vitamin B12 levels. Performed at [facility], [place] [address] [place], [place] [address]   HIV Antibody (routine testing w rflx)     Status: None   Collection Time: 09/29/20  4:08 PM  Result Value Ref Range   HIV Screen 4th Generation wRfx Non Reactive Non Reactive    Comment: Performed at [facility], [place] [address], [place], [place] [address]  CBC     Status: None   Collection Time: 09/30/20  3:41 AM  Result Value Ref Range   WBC 6.6 4.0 - 10.5 K/uL   RBC 4.32 3.87 - 5.11 MIL/uL   Hemoglobin 12.7 12.0 - 15.0 g/dL   HCT 39.1 36.0 - 46.0 %   MCV 90.5 80.0 - 100.0 fL   MCH 29.4 26.0 - 34.0 pg   MCHC 32.5 30.0 - 36.0 g/dL   RDW 12.3 11.5 - 15.5 %   Platelets 338 150 - 400 K/uL   nRBC 0.0 0.0 - 0.2 %    Comment: Performed at [facility], [place] [address] [place], [place] [address]  Comprehensive metabolic panel     Status: Abnormal   Collection Time: 09/30/20  3:41 AM  Result Value Ref Range   Sodium 136 135 - 145 mmol/L   Potassium 3.5 3.5 - 5.1 mmol/L   Chloride 101 98 - 111 mmol/L   CO2 26 22 - 32 mmol/L   Glucose, Bld 239 (H) 70 - 99 mg/dL    Comment: Glucose reference range applies only to samples taken after fasting for at least 8 hours.   BUN 12 8 - 23 mg/dL   Creatinine, Ser 0.91 0.44 - 1.00 mg/dL   Calcium 9.7 8.9 - 10.3 mg/dL   Total Protein 7.7 6.5 - 8.1 g/dL   Albumin 3.5 3.5 - 5.0 g/dL   AST 21 15 - 41 U/L   ALT 16 0 - 44 U/L   Alkaline Phosphatase 64 38 - 126 U/L   Total Bilirubin 0.5 0.3 - 1.2 mg/dL   GFR, Estimated >60 >60 mL/min    Comment: (NOTE) Calculated using the CKD-EPI Creatinine Equation (2021)    Anion gap 9 5 - 15    Comment: Performed at [place] [address] [place], [place] [address]  Glucose, capillary     Status: Abnormal   Collection Time: 09/30/20  9:44 AM  Result Value Ref Range    Glucose-Capillary 209 (H) 70 - 99 mg/dL    Comment: Glucose reference range applies only to samples taken after fasting for at least 8 hours.  Glucose, capillary     Status: Abnormal   Collection Time: 09/30/20 11:19 AM  Result Value Ref Range   Glucose-Capillary 189 (  H) 70 - 99 mg/dL    Comment: Glucose reference range applies only to samples taken after fasting for at least 8 hours.  Glucose, capillary     Status: Abnormal   Collection Time: 09/30/20  5:30 PM  Result Value Ref Range   Glucose-Capillary 176 (H) 70 - 99 mg/dL    Comment: Glucose reference range applies only to samples taken after fasting for at least 8 hours.  Glucose, capillary     Status: Abnormal   Collection Time: 09/30/20  9:30 PM  Result Value Ref Range   Glucose-Capillary 249 (H) 70 - 99 mg/dL    Comment: Glucose reference range applies only to samples taken after fasting for at least 8 hours.  Glucose, capillary     Status: Abnormal   Collection Time: 10/01/20  5:38 AM  Result Value Ref Range   Glucose-Capillary 168 (H) 70 - 99 mg/dL    Comment: Glucose reference range applies only to samples taken after fasting for at least 8 hours.   Comment 1 Notify RN    Comment 2 Document in Chart   Glucose, capillary     Status: Abnormal   Collection Time: 10/01/20 11:28 AM  Result Value Ref Range   Glucose-Capillary 215 (H) 70 - 99 mg/dL    Comment: Glucose reference range applies only to samples taken after fasting for at least 8 hours.    Current Facility-Administered Medications  Medication Dose Route Frequency Provider Last Rate Last Admin   0.9 %  sodium chloride infusion  250 mL Intravenous PRN Burnell Blanks, MD       acetaminophen (TYLENOL) tablet 650 mg  650 mg Oral Q4H PRN Isaiah Serge, NP   650 mg at 09/26/20 0158   ALPRAZolam (XANAX) tablet 0.25 mg  0.25 mg Oral BID PRN Isaiah Serge, NP       aspirin EC tablet 81 mg  81 mg Oral Daily Isaiah Serge, NP   81 mg at 10/01/20 1023    atorvastatin (LIPITOR) tablet 80 mg  80 mg Oral Daily Isaiah Serge, NP   80 mg at 10/01/20 1023   Chlorhexidine Gluconate Cloth 2 % PADS 6 each  6 each Topical Daily Burnell Blanks, MD   6 each at 10/01/20 1024   colchicine tablet 0.6 mg  0.6 mg Oral BID Sande Rives E, PA-C   0.6 mg at 10/01/20 1023   dapagliflozin propanediol (FARXIGA) tablet 10 mg  10 mg Oral Daily Freada Bergeron, MD   10 mg at 10/01/20 1023   hydrOXYzine (ATARAX/VISTARIL) tablet 25 mg  25 mg Oral TID PRN Damita Dunnings B, MD       insulin aspart (novoLOG) injection 0-5 Units  0-5 Units Subcutaneous QHS Oswald Hillock, MD   2 Units at 09/30/20 2148   insulin aspart (novoLOG) injection 0-9 Units  0-9 Units Subcutaneous TID WC Oswald Hillock, MD   3 Units at 10/01/20 1133   insulin glargine (LANTUS) injection 20 Units  20 Units Subcutaneous QHS Oswald Hillock, MD   20 Units at 09/30/20 2148   insulin starter kit- pen needles (English) 1 kit  1 kit Other Once Burnell Blanks, MD       isosorbide mononitrate (IMDUR) 24 hr tablet 30 mg  30 mg Oral Daily Burnell Blanks, MD   30 mg at 10/01/20 1023   living well with diabetes book MISC   Does not apply Once Burnell Blanks, MD  loperamide (IMODIUM) capsule 2 mg  2 mg Oral PRN Burnell Blanks, MD   2 mg at 09/29/20 2111   metoprolol succinate (TOPROL-XL) 24 hr tablet 12.5 mg  12.5 mg Oral Daily Freada Bergeron, MD   12.5 mg at 10/01/20 1023   morphine 2 MG/ML injection 2 mg  2 mg Intravenous Q1H PRN Burnell Blanks, MD   2 mg at 09/30/20 1923   nitroGLYCERIN (NITROSTAT) SL tablet 0.4 mg  0.4 mg Sublingual Q5 Min x 3 PRN Isaiah Serge, NP   0.4 mg at 09/28/20 1816   ondansetron (ZOFRAN) injection 4 mg  4 mg Intravenous Q6H PRN Isaiah Serge, NP       oxyCODONE (Oxy IR/ROXICODONE) immediate release tablet 5-10 mg  5-10 mg Oral Q4H PRN Burnell Blanks, MD   10 mg at 09/30/20 2331   sodium chloride flush (NS)  0.9 % injection 3 mL  3 mL Intravenous Q12H Burnell Blanks, MD   3 mL at 10/01/20 1024   sodium chloride flush (NS) 0.9 % injection 3 mL  3 mL Intravenous PRN Burnell Blanks, MD       spironolactone (ALDACTONE) tablet 12.5 mg  12.5 mg Oral Daily Freada Bergeron, MD   12.5 mg at 10/01/20 1023    Musculoskeletal: Strength & Muscle Tone: within normal limits Gait & Station:  remains in bed Patient leans: N/A            Psychiatric Specialty Exam:  Presentation  General Appearance: Appropriate for Environment  Eye Contact:Good  Speech:Clear and Coherent  Speech Volume:Normal  Handedness: No data recorded  Mood and Affect  Mood:Euthymic  Affect:Appropriate   Thought Process  Thought Processes:Coherent  Descriptions of Associations:Intact  Orientation:Full (Time, Place and Person)  Thought Content:Logical  History of Schizophrenia/Schizoaffective disorder:No data recorded Duration of Psychotic Symptoms:No data recorded Hallucinations:Hallucinations: None  Ideas of Reference:None  Suicidal Thoughts:Suicidal Thoughts: No  Homicidal Thoughts:Homicidal Thoughts: No   Sensorium  Memory:Immediate Good; Recent Good; Remote Good  Judgment:Good  Insight:-- (Improving)   Executive Functions  Concentration:Fair  Attention Span:Good  [place] of Knowledge:Poor  Language:Good   Psychomotor Activity  Psychomotor Activity:Psychomotor Activity: Normal   Assets  Assets:Communication Skills; Desire for Improvement; Housing; Resilience   Sleep  Sleep:Sleep: Fair   Physical Exam: Physical Exam Constitutional:      Appearance: Normal appearance.  HENT:     Head: Normocephalic and atraumatic.  Eyes:     Extraocular Movements: Extraocular movements intact.     Conjunctiva/sclera: Conjunctivae normal.  Cardiovascular:     Rate and Rhythm: Normal rate.  Pulmonary:     Effort: Pulmonary effort is normal.      Breath sounds: Normal breath sounds.  Abdominal:     General: Abdomen is flat.  Musculoskeletal:        General: Normal range of motion.  Skin:    General: Skin is warm and dry.  Neurological:     General: No focal deficit present.     Mental Status: She is alert.   Review of Systems  Constitutional:  Negative for chills and fever.  HENT:  Negative for hearing loss.   Eyes:  Negative for blurred vision.  Respiratory:  Negative for cough and wheezing.   Cardiovascular:  Negative for chest pain.  Gastrointestinal:  Negative for abdominal pain.  Neurological:  Negative for dizziness.  Psychiatric/Behavioral:  Negative for suicidal ideas.   Blood pressure 139/84, pulse 86, temperature 98 F (  36.7 C), temperature source Oral, resp. rate 18, height _0  (1.727 m), weight 88.9 kg, SpO2 100 %. Body mass index is 29.79 kg/m.  Treatment Plan Summary: Anxiety Trypanophobia Patient is doing very well with exposure therapy and making significant progress. Patient is goal oriented and reports that she will continue to be an active participant in her care. Patient reports more trust in her current healthcare team and endorses feeling more comfortable with her care and understanding of her health. Patient is future oriented as she continues to think about how she will continue to improve her health once discharged.  - Atarax 57m TID PRN for anxiety    Will reassess in AM.   Disposition: No evidence of imminent risk to self or others at present.    PGY-1 JFreida Busman MD 10/01/2020 12:51 PM

## 2020-10-01 NOTE — TOC Progression Note (Signed)
Transition of Care [facility]) - Progression Note    Patient Details  Name: Sabrina Ellis MRN: [id number] Date of Birth: 31-Mar-1956  [facility] CM/SW Contact  Ralene Bathe, LCSWA Phone Number: 10/01/2020, 5:23 PM  Clinical Narrative:    CSW received consult to meet with patient to discuss the patient's desire to donate body for medical research once deceased.    The patient was alert and oriented during this encounter.   The patient was given information about 3 different hospitals that her body could possibly be donated to.  The patient informed CSW that she was waiting for CSW to complete a Living Will with her.  CSW informed the patient, after consulting with other CSWs in the [facility] unit, that CSW does not assist with completing Living Wills.   The patient requested assistance with filling out paperwork once she makes a decision about where she would like her body donated to.      Expected Discharge Plan and Services                                                 Social Determinants of Health (SDOH) Interventions    Readmission Risk Interventions No flowsheet data found.
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
## 2020-10-01 NOTE — Progress Notes (Signed)
Patient provided with the living with diabetes booklet and patient encouraged to read booklet.

## 2020-10-01 NOTE — Progress Notes (Signed)
Inpatient Diabetes Program Recommendations  AACE/ADA: New Consensus Statement on Inpatient Glycemic Control (2015)  Target Ranges:  Prepandial:   less than 140 mg/dL      Peak postprandial:   less than 180 mg/dL (1-2 hours)      Critically ill patients:  140 - 180 mg/dL   Lab Results  Component Value Date   GLUCAP 215 (H) 10/01/2020   HGBA1C 14.2 (H) 09/25/2020    Review of Glycemic Control Results for ALIENA, GHRIST (MRN [id number]) as of 10/01/2020 16:47  Ref. Range 09/30/2020 11:19 09/30/2020 17:30 09/30/2020 21:30 10/01/2020 05:38 10/01/2020 11:28  Glucose-Capillary Latest Ref Range: 70 - 99 mg/dL 683 (H) 419 (H) 622 (H) 168 (H) 215 (H)   Diabetes history: DM 2 Outpatient Diabetes medications:  None Current orders for Inpatient glycemic control:  Novolog sensitive tid with meals and HS Lantus 20 units q HS  Inpatient Diabetes Program Recommendations:    Alycia Rossetti, RN, states patient is doing better with sticks that are done by nursing, however sticking herself is going to be very difficult.  Blood sugars have improved.  Encouraged continued teaching with each injection and to encourage patient to stick self.  Will speak with patient tomorrow to reinforce teaching.   Thanks  Beryl Meager, RN, BC-ADM Inpatient Diabetes Coordinator Pager [phone]  (8a-5p)

## 2020-10-01 NOTE — Progress Notes (Signed)
Progress Note  Patient Name: Sabrina Ellis Date of Encounter: 10/02/2020  [facility] Cardiologist: Lauree Chandler, MD   Subjective   Feels very well today. Going to work with PT. She is happy that she has been able to tolerate and self administer insulin.   Seen by palliative; remains partial code and will continue to pursue full scope of treatment  Inpatient Medications    Scheduled Meds:  aspirin EC  81 mg Oral Daily   atorvastatin  80 mg Oral Daily   Chlorhexidine Gluconate Cloth  6 each Topical Daily   colchicine  0.6 mg Oral BID   dapagliflozin propanediol  10 mg Oral Daily   [START ON 10/05/2020] epinephrine  0-10 mcg/min Intravenous To OR   [START ON 10/05/2020] heparin-papaverine-plasmalyte irrigation   Irrigation To OR   insulin aspart  0-5 Units Subcutaneous QHS   insulin aspart  0-9 Units Subcutaneous TID WC   insulin glargine  20 Units Subcutaneous QHS   [START ON 10/05/2020] insulin   Intravenous To OR   insulin starter kit- pen needles  1 kit Other Once   isosorbide mononitrate  30 mg Oral Daily   living well with diabetes book   Does not apply Once   [START ON 10/05/2020] magnesium sulfate  40 mEq Other To OR   metoprolol succinate  12.5 mg Oral Daily   [START ON 10/05/2020] phenylephrine  30-200 mcg/min Intravenous To OR   [START ON 10/05/2020] potassium chloride  80 mEq Other To OR   sodium chloride flush  3 mL Intravenous Q12H   spironolactone  12.5 mg Oral Daily   [START ON 10/05/2020] tranexamic acid  15 mg/kg Intravenous To OR   [START ON 10/05/2020] tranexamic acid  2 mg/kg Intracatheter To OR   Continuous Infusions:  sodium chloride     [START ON 10/05/2020]  ceFAZolin (ANCEF) IV     [START ON 10/05/2020]  ceFAZolin (ANCEF) IV     [START ON 10/05/2020] dexmedetomidine     [START ON 10/05/2020] heparin 30,000 units/NS 1000 mL solution for CELLSAVER     [START ON 10/05/2020] milrinone     [START ON 10/05/2020] nitroGLYCERIN     [START ON  10/05/2020] norepinephrine     [START ON 10/05/2020] tranexamic acid (CYKLOKAPRON) infusion ([facility])     [START ON 10/05/2020] vancomycin     PRN Meds: sodium chloride, acetaminophen, ALPRAZolam, hydrOXYzine, loperamide, morphine injection, nitroGLYCERIN, ondansetron (ZOFRAN) IV, oxyCODONE, sodium chloride flush   Vital Signs    Vitals:   10/01/20 1129 10/01/20 2012 10/02/20 0456 10/02/20 0826  BP: 139/84 (!) 153/81 123/64 116/65  Pulse: 86 94 68 91  Resp:  18 18   Temp:  (!) 97.5 F (36.4 C) 98.7 F (37.1 C)   TempSrc:  Oral Oral   SpO2: 100% 98% 98% 99%  Weight:   89 kg   Height:        Intake/Output Summary (Last 24 hours) at 10/02/2020 0936 Last data filed at 10/02/2020 0510 Gross per 24 hour  Intake 720 ml  Output 450 ml  Net 270 ml   Last 3 Weights 10/02/2020 10/01/2020 09/30/2020  Weight (lbs) 196 lb 1.6 oz 195 lb 14.4 oz 195 lb 11.2 oz  Weight (kg) 88.95 kg 88.86 kg 88.769 kg      Telemetry    NSR - Personally Reviewed  ECG    No new tracing - Personally Reviewed  Physical Exam   GEN: No acute distress. Comfortable and sitting up in  bed Neck: No JVD Cardiac: RRR, no murmurs, rubs, or gallops.  Respiratory: Clear to auscultation bilaterally. GI: Soft, nontender, non-distended  MS: No edema; No deformity. Neuro:  AAOx3 Psych: Normal affect   Labs    High Sensitivity Troponin:   Recent Labs  Lab 09/25/20 1709 09/25/20 2006 09/26/20 0243  TROPONINIHS 9,554* 11,565* 11,491*      Chemistry Recent Labs  Lab 09/25/20 1709 09/26/20 0243 09/30/20 0341  NA 133* 131* 136  K 3.3* 4.1 3.5  CL 96* 97* 101  CO2 _0 GLUCOSE 477* 433* 239*  BUN 9 7* 12  CREATININE 0.85 0.73 0.91  CALCIUM 9.7 9.0 9.7  PROT 7.8  --  7.7  ALBUMIN 3.7  --  3.5  AST 25  --  21  ALT 15  --  16  ALKPHOS 68  --  64  BILITOT 0.7  --  0.5  GFRNONAA >60 >60 >60  ANIONGAP _1 Hematology Recent Labs  Lab 09/25/20 1709 09/26/20 0243 09/30/20 0341  WBC 7.2  7.2  7.2 6.6  RBC 4.76  4.71 4.18 4.32  HGB 14.0  14.0 12.4 12.7  HCT 42.2  41.7 37.2 39.1  MCV 88.7  88.5 89.0 90.5  MCH 29.4  29.7 29.7 29.4  MCHC 33.2  33.6 33.3 32.5  RDW 12.1  12.1 12.4 12.3  PLT 284  280 267 338    BNPNo results for input(s): BNP, PROBNP in the last 168 hours.   DDimer No results for input(s): DDIMER in the last 168 hours.   Radiology    CT ANGIO HEAD NECK W WO CM  Result Date: 10/01/2020 CLINICAL DATA:  Stroke EXAM: CT ANGIOGRAPHY HEAD AND NECK TECHNIQUE: Multidetector CT imaging of the head and neck was performed using the standard protocol during bolus administration of intravenous contrast. Multiplanar CT image reconstructions and MIPs were obtained to evaluate the vascular anatomy. Carotid stenosis measurements (when applicable) are obtained utilizing NASCET criteria, using the distal internal carotid diameter as the denominator. CONTRAST:  41m OMNIPAQUE IOHEXOL 350 MG/ML SOLN COMPARISON:  MRI head 09/29/2020 FINDINGS: CT HEAD FINDINGS Brain: Small areas of acute infarct in the right parietal lobe and right cerebellum and left cerebellum are best identified on diffusion-weighted imaging. Negative for acute hemorrhage or mass. Mild white matter changes consistent with chronic microvascular ischemia. Vascular: Negative for hyperdense vessel Skull: Negative Sinuses: Negative Orbits: Negative Review of the MIP images confirms the above findings CTA NECK FINDINGS Aortic arch: Mild atherosclerotic calcification aortic arch. No aneurysm. Proximal great vessels widely patent Right carotid system: Mild atherosclerotic disease right carotid bifurcation without significant stenosis Left carotid system: Mild atherosclerotic disease left carotid bifurcation without stenosis. Vertebral arteries: Both vertebral arteries patent to the basilar without stenosis. Skeleton: No acute abnormality. Other neck: Multinodular goiter. Largest nodules include 13 mm right lower pole nodule and  13 mm cystic nodule left midpole. No further imaging necessary based on nodule size. Upper chest: Mild apical emphysema.  No acute abnormality. Review of the MIP images confirms the above findings CTA HEAD FINDINGS Anterior circulation: Internal carotid artery patent bilaterally. Anterior and middle cerebral arteries widely patent without stenosis or large vessel occlusion. Posterior circulation: Both vertebral arteries patent to the basilar. Distal left vertebral artery is hypoplastic. PICA patent bilaterally. Basilar widely patent. AICA patent. Basilar ends in the superior cerebellar arteries bilaterally. Fetal origin of the posterior cerebral artery bilaterally without stenosis. Venous sinuses: Normal venous enhancement. Anatomic  variants: None Review of the MIP images confirms the above findings IMPRESSION: 1. Acute infarcts in the cerebellum bilaterally and right parietal lobe best seen on diffusion-weighted imaging. Negative for hemorrhage. 2. No intracranial large vessel occlusion 3. No significant carotid or vertebral artery stenosis in the neck. Electronically Signed   By: Franchot Gallo M.D.   On: 10/01/2020 17:11    Cardiac Studies   Echo 09/26/2020  1. Poor quality images even with definity Markedly abnormal septal motion  inferior wall hypokinesis . Left ventricular ejection fraction, by  estimation, is 40 to 45%. The left ventricle has mildly decreased  function. The left ventricle demonstrates  regional wall motion abnormalities (see scoring diagram/findings for  description). Left ventricular diastolic parameters were normal.   2. Right ventricular systolic function is normal. The right ventricular  size is normal.   3. Right atrial size was mildly dilated.   4. The mitral valve is abnormal. Trivial mitral valve regurgitation. No  evidence of mitral stenosis.   5. The aortic valve is tricuspid. There is mild calcification of the  aortic valve. Aortic valve regurgitation is not  visualized. Mild aortic  valve sclerosis is present, with no evidence of aortic valve stenosis.   6. The inferior vena cava is normal in size with greater than 50%  respiratory variability, suggesting right atrial pressure of 3 mmHg.   Cath 09/25/20 CORONARY STENT INTERVENTION  LEFT HEART CATH AND CORONARY ANGIOGRAPHY      Conclusion     Mid Cx lesion is 80% stenosed. Prox LAD lesion is 70% stenosed. Ost RCA to Dist RCA lesion is 100% stenosed.   1. Late presenting inferior ST elevation MI secondary to occlusion of the large, dominant RCA in the proximal segment. Unsuccessful attempt at PCI. There are left to right collaterals filling the distal RCA branches. 2. Moderately severe proximal LAD stenosis 3. Severe mid Circumflex stenosis   Recommendations: She is pain free. Unable to perform PCI of the RCA as I could not pass a wire beyond the totally occluded proximal vessel. She is felt to have a late presenting inferior MI. Given three vessel CAD, will consider CABG as an option for revascularization. I will admit her to the ICU. Continue ASA/Brilinta for now. Will start a high intensity statin. Start a beta blocker. Echo in the am. CT surgery consultation this weekend to consider CABG. We will call for consultation tomorrow and if she is felt to be a good candidate for bypass, would stop the Brilinta and start IV heparin. All labs pending at this time.   Diagnostic Dominance: Right        Patient Profile     65 y.o. female with PMH of poorly controlled DMII who presented with a STEMI found to have occluded large, dominant RCA in the proximal segment with attempted but unsuccessful PCI, moderate to severe proximal LAD stenosis, severe mid circumflex stenosis.  Given multivessel, severe CAD the patient was recommended for CABG. Due to initial decision to be DNR; CABG evaluation deferred however, patient has decided she would like to change code status to limited resuscitation and is  willing to be full code for the procedure. Overall, difficult situation as patient is suffering from memory issues, has poorly controlled DMII and has been hesitant with medical care. Fortunately, this is improving and now undergoing work-up for possible CABG next week.  Assessment & Plan    #Late Presentation of Inferior STEMI: #Multivessel CAD: Patient presented with 2-24monthhistory of worsening chest  pain that acutely worsened about 1 week prior to admission. Underwent coronary angiography as detailed above found to have multivessel CAD not amenable to PCI. Has been recommended for CABG, but has been difficult to navigate due to patient's initial code status of DNR which has subsequently been clarified to limited resuscitation. Has been seen by CV surgery now planned for CABG work-up with tentative plans for OR next week. -CV surgery consulted; undergoing CABG work-up -Holding plavix given plans for OR -Continue ASA 58m daily -Continue lipitor 824mdaily -Continue metop 12.36m80mL daily -Will add entresto post-OR as able  #Ischemic CM: LVEF 40-45% with septal and inferior wall hypokinesis in the setting of multivessel CAD and inferior STEMI as above. Clinically euvolemic and compensated. Currently undergoing evaluation for revascularization vs medical management. -Not on diuretics, appears compensated -Continue metop 12.36mg70m daily -Continue spironolactone 12.36mg 26mly -Continue farxiga 10mg 41my -Will add entresto post-OR as able as above -Manage multivessel CAD as above  #Possible pericarditis: Pleuritic chest pain during hospitalization. Now improved with colchicine. -Continue colchicine  #Uncontrolled DMII: A1C 14. Difficult situation to manage, but patient has been more amenable to BG checks and insulin administration over the past couple of days. -Management per IM -Doing significantly better with blood glucose checks and insulin administration  #Acute cerebellar infarcts  on MRI: Suspicious for cardioembolic source. Would not explain memory loss per Neuro. -Work-up per neuro -Continue ASA; holding plavix given plan for CABG and will resume post-op -Will need 30day event monitor on discharge  #Memory Difficulties: #Depression: Has been ongoing issue throughout hospitalization. MRI brain with evidence of acute ischemia in the cerebellum, but not likely to be the source of memory loss. Neurology following as detailed above. -Psych consulted, appreciate recommendations  #HLD: -Continue lipitor 80mg d34m  For questions or updates, please contact [facility] consult [email or web address] for contact info under    Signed, HeatherFreada Bergeron/17/2022, 9:36 AM

## 2020-10-01 NOTE — Evaluation (Signed)
Clinical/Bedside Swallow Evaluation Patient Details  Name: Sabrina Ellis MRN: [id number] Date of Birth: 04/17/1956  Today's Date: 10/01/2020 Time: SLP Start Time (ACUTE ONLY): 1413 SLP Stop Time (ACUTE ONLY): 1430 SLP Time Calculation (min) (ACUTE ONLY): 17 min  Past Medical History:  Past Medical History:  Diagnosis Date   STEMI (ST elevation myocardial infarction) (HCC) 09/25/2020   Tobacco abuse stopped 5 yrs ago    Past Surgical History: The histories are not reviewed yet. Please review them in the "History" navigator section and refresh this SmartLink. HPI:  Sabrina Ellis is a 65 year old female with history of diabetes mellitus type 2, who does not follow with PCP, who came to hospital with episodic chest pain for past 2 to 3 months.  She was taken to [facility] where EKG showed ST elevation in leads 2, 3, aVF.  She was brought to [facility] for evaluation for cardiac catheterization. Cardiac cath showed inferior STEMI secondary to occlusion of large, dominant RCA proximally.  PCI was unsuccessful.  Patient also was found to have moderate to severe proximal LAD stenosis.  CABG was recommended.  Hospitalization further complicated by patient's underlying psych issues, memory loss.  Due to her memory loss, MRI brain was completed which revealed acute ischemia.  Neurology consulted for stroke work-up   Assessment / Plan / Recommendation Clinical Impression  Pts oropharyngeal swallow appears grossly intact. Oral motor exam unremarkable for weakness. Pt with missing dentition in poor condition, states does not see dentist due to fear of needles. No overt s/sx of aspiration with thin liquids, puree, or solid PO. Continue regular thin liquid diet. No further ST needs identified.  SLP Visit Diagnosis: Dysphagia, unspecified (R13.10)    Aspiration Risk  No limitations    Diet Recommendation   Regular thin liquids   Medication Administration: Whole meds with liquid     Other  Recommendations Oral Care Recommendations: Oral care BID   Follow up Recommendations None      Frequency and Duration            Prognosis Prognosis for Safe Diet Advancement: Good      Swallow Study   General Date of Onset: 09/25/20 HPI: Sabrina Ellis is a 65 year old female with history of diabetes mellitus type 2, who does not follow with PCP, who came to hospital with episodic chest pain for past 2 to 3 months.  She was taken to [facility] where EKG showed ST elevation in leads 2, 3, aVF.  She was brought to [facility] for evaluation for cardiac catheterization. Cardiac cath showed inferior STEMI secondary to occlusion of large, dominant RCA proximally.  PCI was unsuccessful.  Patient also was found to have moderate to severe proximal LAD stenosis.  CABG was recommended.  Hospitalization further complicated by patient's underlying psych issues, memory loss.  Due to her memory loss, MRI brain was completed which revealed acute ischemia.  Neurology consulted for stroke work-up Type of Study: Bedside Swallow Evaluation Previous Swallow Assessment: none on file Diet Prior to this Study: Regular;Thin liquids Temperature Spikes Noted: No Respiratory Status: Room air History of Recent Intubation: No Behavior/Cognition: Alert;Cooperative (tangential) Oral Cavity Assessment: Within Functional Limits Oral Care Completed by SLP: No Oral Cavity - Dentition: Missing dentition;Poor condition Vision: Functional for self-feeding Self-Feeding Abilities: Able to feed self Patient Positioning: Upright in bed Baseline Vocal Quality: Normal Volitional Swallow: Able to elicit    Oral/Motor/Sensory Function Overall Oral Motor/Sensory Function: Within functional limits   Ice Chips Ice chips:  Not tested   Thin Liquid Thin Liquid: Within functional limits Presentation: Cup;Straw    Nectar Thick Nectar Thick Liquid: Not tested   Honey Thick Honey Thick Liquid: Not  tested   Puree Puree: Within functional limits   Solid     Solid: Within functional limits      Ardyth Gal MA, CCC-SLP Acute Rehabilitation Services   10/01/2020,2:40 PM

## 2020-10-01 NOTE — Progress Notes (Signed)
 Progress Note  Patient Name: Sabrina Ellis Date of Encounter: 10/01/2020  [facility] Cardiologist: Christopher McAlhany, MD   Subjective   MRI brain showed "acute ischemia within both cerebellar hemispheres and R parietal lobe with findings of chronic microvascular ischemia." Neurology consulted and planned for TTE with bubble study.  Otherwise, blood pressure controlled, HR 70-90s in NSR BG much better and patient allowed insulin and BG checks overnight Seen by CV surgery and starting CABG work-up  Inpatient Medications    Scheduled Meds:  aspirin EC  81 mg Oral Daily   atorvastatin  80 mg Oral Daily   Chlorhexidine Gluconate Cloth  6 each Topical Daily   colchicine  0.6 mg Oral BID   dapagliflozin propanediol  10 mg Oral Daily   insulin aspart  0-5 Units Subcutaneous QHS   insulin aspart  0-9 Units Subcutaneous TID WC   insulin glargine  20 Units Subcutaneous QHS   insulin starter kit- pen needles  1 kit Other Once   isosorbide mononitrate  30 mg Oral Daily   living well with diabetes book   Does not apply Once   metoprolol succinate  12.5 mg Oral Daily   sodium chloride flush  3 mL Intravenous Q12H   spironolactone  12.5 mg Oral Daily   Continuous Infusions:  sodium chloride     PRN Meds: sodium chloride, acetaminophen, ALPRAZolam, hydrOXYzine, loperamide, morphine injection, nitroGLYCERIN, ondansetron (ZOFRAN) IV, oxyCODONE, sodium chloride flush   Vital Signs    Vitals:   09/30/20 1117 09/30/20 2037 10/01/20 0450 10/01/20 0500  BP: 114/60 135/70 126/80   Pulse: 83 87 78   Resp: 18 16 18   Temp: 98.6 F (37 C) 99.1 F (37.3 C) 98.1 F (36.7 C)   TempSrc: Oral Oral Oral   SpO2: 95% 97% 99%   Weight:    88.9 kg  Height:        Intake/Output Summary (Last 24 hours) at 10/01/2020 0642 Last data filed at 09/30/2020 2039 Gross per 24 hour  Intake 954 ml  Output --  Net 954 ml    Last 3 Weights 10/01/2020 09/30/2020 09/29/2020  Weight (lbs) 195  lb 14.4 oz 195 lb 11.2 oz 199 lb 1.6 oz  Weight (kg) 88.86 kg 88.769 kg 90.311 kg      Telemetry    NSR - Personally Reviewed  ECG    No new ECG - Personally Reviewed  Physical Exam   GEN: No acute distress. Comfortable and sitting up in bed Neck: No JVD Cardiac: RRR, no murmurs, rubs, or gallops.  Respiratory: Clear to auscultation bilaterally. GI: Soft, nontender, non-distended  MS: No edema; No deformity. Neuro:  AAOx3 Psych: Normal affect   Labs    High Sensitivity Troponin:   Recent Labs  Lab 09/25/20 1709 09/25/20 2006 09/26/20 0243  TROPONINIHS 9,554* 11,565* 11,491*       Chemistry Recent Labs  Lab 09/25/20 1709 09/26/20 0243 09/30/20 0341  NA 133* 131* 136  K 3.3* 4.1 3.5  CL 96* 97* 101  CO2 24 25 26  GLUCOSE 477* 433* 239*  BUN 9 7* 12  CREATININE 0.85 0.73 0.91  CALCIUM 9.7 9.0 9.7  PROT 7.8  --  7.7  ALBUMIN 3.7  --  3.5  AST 25  --  21  ALT 15  --  16  ALKPHOS 68  --  64  BILITOT 0.7  --  0.5  GFRNONAA >60 >60 >60  ANIONGAP 13 9 9        Hematology Recent Labs  Lab 09/25/20 1709 09/26/20 0243 09/30/20 0341  WBC 7.2  7.2 7.2 6.6  RBC 4.76  4.71 4.18 4.32  HGB 14.0  14.0 12.4 12.7  HCT 42.2  41.7 37.2 39.1  MCV 88.7  88.5 89.0 90.5  MCH 29.4  29.7 29.7 29.4  MCHC 33.2  33.6 33.3 32.5  RDW 12.1  12.1 12.4 12.3  PLT 284  280 267 338     BNPNo results for input(s): BNP, PROBNP in the last 168 hours.   DDimer No results for input(s): DDIMER in the last 168 hours.   Radiology    MR BRAIN WO CONTRAST  Result Date: 09/29/2020 CLINICAL DATA:  Memory loss EXAM: MRI HEAD WITHOUT CONTRAST TECHNIQUE: Multiplanar, multiecho pulse sequences of the brain and surrounding structures were obtained without intravenous contrast. COMPARISON:  None. FINDINGS: Brain: Small foci of abnormal diffusion restriction within both cerebellar hemispheres and in the right parietal lobe. Single focus of chronic microhemorrhage in the right  temporal lobe. There is multifocal hyperintense T2-weighted signal within the white matter. Parenchymal volume and CSF spaces are normal. The midline structures are normal. Vascular: Major flow voids are preserved. Skull and upper cervical spine: Normal calvarium and skull base. Visualized upper cervical spine and soft tissues are normal. Sinuses/Orbits:No paranasal sinus fluid levels or advanced mucosal thickening. No mastoid or middle ear effusion. Normal orbits. IMPRESSION: 1. Small foci of acute ischemia within both cerebellar hemispheres and right parietal lobe. No hemorrhage or mass effect. 2. Findings of chronic microvascular ischemia. Electronically Signed   By: Kevin  Herman M.D.   On: 09/29/2020 23:01    Cardiac Studies   Echo 09/26/2020  1. Poor quality images even with definity Markedly abnormal septal motion  inferior wall hypokinesis . Left ventricular ejection fraction, by  estimation, is 40 to 45%. The left ventricle has mildly decreased  function. The left ventricle demonstrates  regional wall motion abnormalities (see scoring diagram/findings for  description). Left ventricular diastolic parameters were normal.   2. Right ventricular systolic function is normal. The right ventricular  size is normal.   3. Right atrial size was mildly dilated.   4. The mitral valve is abnormal. Trivial mitral valve regurgitation. No  evidence of mitral stenosis.   5. The aortic valve is tricuspid. There is mild calcification of the  aortic valve. Aortic valve regurgitation is not visualized. Mild aortic  valve sclerosis is present, with no evidence of aortic valve stenosis.   6. The inferior vena cava is normal in size with greater than 50%  respiratory variability, suggesting right atrial pressure of 3 mmHg.   Cath 09/25/20 CORONARY STENT INTERVENTION  LEFT HEART CATH AND CORONARY ANGIOGRAPHY      Conclusion     Mid Cx lesion is 80% stenosed. Prox LAD lesion is 70% stenosed. Ost RCA to  Dist RCA lesion is 100% stenosed.   1. Late presenting inferior ST elevation MI secondary to occlusion of the large, dominant RCA in the proximal segment. Unsuccessful attempt at PCI. There are left to right collaterals filling the distal RCA branches. 2. Moderately severe proximal LAD stenosis 3. Severe mid Circumflex stenosis   Recommendations: She is pain free. Unable to perform PCI of the RCA as I could not pass a wire beyond the totally occluded proximal vessel. She is felt to have a late presenting inferior MI. Given three vessel CAD, will consider CABG as an option for revascularization. I will admit her to the ICU. Continue ASA/Brilinta   for now. Will start a high intensity statin. Start a beta blocker. Echo in the am. CT surgery consultation this weekend to consider CABG. We will call for consultation tomorrow and if she is felt to be a good candidate for bypass, would stop the Brilinta and start IV heparin. All labs pending at this time.   Diagnostic Dominance: Right        Patient Profile     64 y.o. female with PMH of poorly controlled DMII who presented with a STEMI found to have occluded large, dominant RCA in the proximal segment with attempted but unsuccessful PCI, moderate to severe proximal LAD stenosis, severe mid circumflex stenosis.  Given multivessel, severe CAD the patient was recommended for CABG. Due to initial decision to be DNR; CABG evaluation deferred however, patient has decided she would like to change code status to limited resuscitation and is willing to be full code for the procedure. Overall, difficult situation as patient is suffering from memory issues, has poorly controlled DMII and has been hesitant with medical care. Fortunately, this is improving and now undergoing work-up for possible CABG next week.  Assessment & Plan    #Late Presentation of Inferior STEMI: #Multivessel CAD: Patient presented with 2-3month history of worsening chest pain that acutely  worsened about 1 week prior to admission. Underwent coronary angiography as detailed above found to have multivessel CAD not amenable to PCI. Has been recommended for CABG, but has been difficult to navigate due to patient's initial code status of DNR which has subsequently been clarified to limited resuscitation. Has been seen by CV surgery now planned for CABG work-up with tentative plans for OR next week. -CV surgery consulted; undergoing CABG work-up -Holding plavix  -Continue ASA 81mg daily -Continue lipitor 80mg daily -Continue metop 12.5mg XL daily -Will add entresto post-OR as able  #Ischemic CM: LVEF 40-45% with septal and inferior wall hypokinesis in the setting of multivessel CAD and inferior STEMI as above. Clinically euvolemic and compensated. Currently undergoing evaluation for revascularization vs medical management. -Not on diuretics, appears compensated -Continue metop 12.5mg XL daily -Continue spironolactone 12.5mg daily -Continue farxiga 10mg daily -Will add entresto post-OR as able as above -Manage multivessel CAD as above  #Possible pericarditis: Pleuritic chest pain during hospitalization. Now improved with colchicine. -Continue colchicine  #Uncontrolled DMII: A1C 14. Difficult situation to manage, but patient has been more amenable to BG checks and insulin administration over the past 24h. -Agree with DMII education -Management per IM  #Acute cerebellar infarcts on MRI: Suspicious for cardioembolic source. Would not explain memory loss per Neuro. -Work-up per neuro -Planned for TTE with bubble -Continue ASA; holding plavix given plan for CABG and will resume post-op -May need loop long-term if TTE/TEE unrevealing  #Memory Difficulties: #Depression: Has been ongoing issue throughout hospitalization. MRI brain with evidence of acute ischemia in the cerebellum, but not likely to be the source of memory loss. Neurology following as detailed above. -Psych  consulted, appreciate recommendations  #HLD: -Continue lipitor 80mg daily  For questions or updates, please contact [facility] Please consult [email or web address] for contact info under    Signed, Heather E Pemberton, MD  10/01/2020, 6:42 AM    

## 2020-10-01 NOTE — Progress Notes (Signed)
STROKE TEAM PROGRESS NOTE   SUBJECTIVE (INTERVAL HISTORY) No family is at the bedside.  She is sitting in bed, neurologically intact, language fluent, no aphasia. She complains that she had short term memory loss for about a year, and getting worse. So far dementia labs neg, however, MRI showed scattered small/punctate infarcts bilateral cerebellum and right parietal lobe.  CTA head and neck pending.  Patient on aspirin now and plan for CABG soon.   OBJECTIVE Temp:  [98 F (36.7 C)-99.1 F (37.3 C)] 98 F (36.7 C) (06/16 0751) Pulse Rate:  [78-87] 86 (06/16 1129) Cardiac Rhythm: Sinus bradycardia (06/16 0710) Resp:  [16-18] 18 (06/16 0450) BP: (126-139)/(69-84) 139/84 (06/16 1129) SpO2:  [97 %-100 %] 100 % (06/16 1129) Weight:  [88.9 kg] 88.9 kg (06/16 0500)  Recent Labs  Lab 09/30/20 1119 09/30/20 1730 09/30/20 2130 10/01/20 0538 10/01/20 1128  GLUCAP 189* 176* 249* 168* 215*   Recent Labs  Lab 09/25/20 1702 09/25/20 1709 09/25/20 1823 09/26/20 0243 09/30/20 0341  NA 133* 133*  --  131* 136  K 3.8 3.3*  --  4.1 3.5  CL 95* 96*  --  97* 101  CO2  --  24  --  25 26  GLUCOSE 562* 477*  --  433* 239*  BUN 10 9  --  7* 12  CREATININE 0.70 0.85  --  0.73 0.91  CALCIUM  --  9.7  --  9.0 9.7  MG  --   --  2.1  --   --    Recent Labs  Lab 09/25/20 1709 09/30/20 0341  AST 25 21  ALT 15 16  ALKPHOS 68 64  BILITOT 0.7 0.5  PROT 7.8 7.7  ALBUMIN 3.7 3.5   Recent Labs  Lab 09/25/20 1702 09/25/20 1709 09/26/20 0243 09/30/20 0341  WBC  --  7.2  7.2 7.2 6.6  NEUTROABS  --  4.0  --   --   HGB 14.6 14.0  14.0 12.4 12.7  HCT 43.0 42.2  41.7 37.2 39.1  MCV  --  88.7  88.5 89.0 90.5  PLT  --  284  280 267 338   No results for input(s): CKTOTAL, CKMB, CKMBINDEX, TROPONINI in the last 168 hours. No results for input(s): LABPROT, INR in the last 72 hours. No results for input(s): COLORURINE, LABSPEC, PHURINE, GLUCOSEU, HGBUR, BILIRUBINUR, KETONESUR, PROTEINUR,  UROBILINOGEN, NITRITE, LEUKOCYTESUR in the last 72 hours.  Invalid input(s): APPERANCEUR     Component Value Date/Time   CHOL 186 09/26/2020 0243   TRIG 209 (H) 09/26/2020 0243   HDL 24 (L) 09/26/2020 0243   CHOLHDL 7.8 09/26/2020 0243   VLDL 42 (H) 09/26/2020 0243   LDLCALC 120 (H) 09/26/2020 0243   Lab Results  Component Value Date   HGBA1C 14.2 (H) 09/25/2020   No results found for: LABOPIA, COCAINSCRNUR, LABBENZ, AMPHETMU, THCU, LABBARB  No results for input(s): ETH in the last 168 hours.  I have personally reviewed the radiological images below and agree with the radiology interpretations.  MR BRAIN WO CONTRAST  Result Date: 09/29/2020 CLINICAL DATA:  Memory loss EXAM: MRI HEAD WITHOUT CONTRAST TECHNIQUE: Multiplanar, multiecho pulse sequences of the brain and surrounding structures were obtained without intravenous contrast. COMPARISON:  None. FINDINGS: Brain: Small foci of abnormal diffusion restriction within both cerebellar hemispheres and in the right parietal lobe. Single focus of chronic microhemorrhage in the right temporal lobe. There is multifocal hyperintense T2-weighted signal within the white matter. Parenchymal volume and CSF  spaces are normal. The midline structures are normal. Vascular: Major flow voids are preserved. Skull and upper cervical spine: Normal calvarium and skull base. Visualized upper cervical spine and soft tissues are normal. Sinuses/Orbits:No paranasal sinus fluid levels or advanced mucosal thickening. No mastoid or middle ear effusion. Normal orbits. IMPRESSION: 1. Small foci of acute ischemia within both cerebellar hemispheres and right parietal lobe. No hemorrhage or mass effect. 2. Findings of chronic microvascular ischemia. Electronically Signed   By: Deatra Robinson M.D.   On: 09/29/2020 23:01   CARDIAC CATHETERIZATION  Result Date: 09/25/2020  Mid Cx lesion is 80% stenosed.  Prox LAD lesion is 70% stenosed.  Ost RCA to Dist RCA lesion is 100%  stenosed.  1. Late presenting inferior ST elevation MI secondary to occlusion of the large, dominant RCA in the proximal segment. Unsuccessful attempt at PCI. There are left to right collaterals filling the distal RCA branches. 2. Moderately severe proximal LAD stenosis 3. Severe mid Circumflex stenosis Recommendations: She is pain free. Unable to perform PCI of the RCA as I could not pass a wire beyond the totally occluded proximal vessel. She is felt to have a late presenting inferior MI. Given three vessel CAD, will consider CABG as an option for revascularization. I will admit her to the ICU. Continue ASA/Brilinta for now. Will start a high intensity statin. Start a beta blocker. Echo in the am. CT surgery consultation this weekend to consider CABG. We will call for consultation tomorrow and if she is felt to be a good candidate for bypass, would stop the Brilinta and start IV heparin. All labs pending at this time.   Portable chest x-ray 1 view  Result Date: 09/25/2020 CLINICAL DATA:  Chest pain EXAM: PORTABLE CHEST 1 VIEW COMPARISON:  None. FINDINGS: Heart and mediastinal contours are within normal limits. No focal opacities or effusions. No acute bony abnormality. IMPRESSION: No active disease. Electronically Signed   By: Charlett Nose M.D.   On: 09/25/2020 20:09   ECHOCARDIOGRAM COMPLETE  Result Date: 09/26/2020    ECHOCARDIOGRAM REPORT   Patient Name:   Sabrina Ellis Date of Exam: 09/26/2020 Medical Rec #:  [id number]            Height:       68.0 in Accession #:    [id number]           Weight:       196.2 lb Date of Birth:  [birth year]            BSA:          2.027 m Patient Age:    64 years             BP:           127/73 mmHg Patient Gender: F                    HR:           76 bpm. Exam Location:  Inpatient Procedure: 2D Echo, Cardiac Doppler, Color Doppler and Intracardiac            Opacification Agent Indications:    R07.9* Chest pain, unspecified  History:        Patient has no prior  history of Echocardiogram examinations.                 Acute MI and CAD.  Sonographer:    Roosvelt Maser RDCS Referring Phys: [id number] LAURA R INGOLD IMPRESSIONS  1.  Poor quality images even with definity Markedly abnormal septal motion inferior wall hypokinesis . Left ventricular ejection fraction, by estimation, is 40 to 45%. The left ventricle has mildly decreased function. The left ventricle demonstrates regional wall motion abnormalities (see scoring diagram/findings for description). Left ventricular diastolic parameters were normal.  2. Right ventricular systolic function is normal. The right ventricular size is normal.  3. Right atrial size was mildly dilated.  4. The mitral valve is abnormal. Trivial mitral valve regurgitation. No evidence of mitral stenosis.  5. The aortic valve is tricuspid. There is mild calcification of the aortic valve. Aortic valve regurgitation is not visualized. Mild aortic valve sclerosis is present, with no evidence of aortic valve stenosis.  6. The inferior vena cava is normal in size with greater than 50% respiratory variability, suggesting right atrial pressure of 3 mmHg. FINDINGS  Left Ventricle: Poor quality images even with definity Markedly abnormal septal motion inferior wall hypokinesis. Left ventricular ejection fraction, by estimation, is 40 to 45%. The left ventricle has mildly decreased function. The left ventricle demonstrates regional wall motion abnormalities. The left ventricular internal cavity size was normal in size. There is no left ventricular hypertrophy. Left ventricular diastolic parameters were normal. Right Ventricle: The right ventricular size is normal. No increase in right ventricular wall thickness. Right ventricular systolic function is normal. Left Atrium: Left atrial size was normal in size. Right Atrium: Right atrial size was mildly dilated. Pericardium: There is no evidence of pericardial effusion. Mitral Valve: The mitral valve is abnormal. There is  mild thickening of the mitral valve leaflet(s). Trivial mitral valve regurgitation. No evidence of mitral valve stenosis. Tricuspid Valve: The tricuspid valve is normal in structure. Tricuspid valve regurgitation is mild . No evidence of tricuspid stenosis. Aortic Valve: The aortic valve is tricuspid. There is mild calcification of the aortic valve. Aortic valve regurgitation is not visualized. Mild aortic valve sclerosis is present, with no evidence of aortic valve stenosis. Aortic valve mean gradient measures 3.0 mmHg. Aortic valve peak gradient measures 5.5 mmHg. Aortic valve area, by VTI measures 2.96 cm. Pulmonic Valve: The pulmonic valve was normal in structure. Pulmonic valve regurgitation is not visualized. No evidence of pulmonic stenosis. Aorta: The aortic root is normal in size and structure. Venous: The inferior vena cava is normal in size with greater than 50% respiratory variability, suggesting right atrial pressure of 3 mmHg. IAS/Shunts: No atrial level shunt detected by color flow Doppler.  LEFT VENTRICLE PLAX 2D LVIDd:         4.40 cm  Diastology LVIDs:         3.10 cm  LV e' medial:    6.64 cm/s LV PW:         1.00 cm  LV E/e' medial:  8.8 LV IVS:        1.10 cm  LV e' lateral:   11.50 cm/s LVOT diam:     2.00 cm  LV E/e' lateral: 5.1 LV SV:         51 LV SV Index:   25 LVOT Area:     3.14 cm  RIGHT VENTRICLE RV Basal diam:  4.70 cm RV Mid diam:    4.20 cm RV S prime:     7.29 cm/s TAPSE (M-mode): 0.9 cm LEFT ATRIUM           Index       RIGHT ATRIUM           Index LA diam:  2.60 cm 1.28 cm/m  RA Area:     20.60 cm LA Vol (A2C): 45.1 ml 22.25 ml/m RA Volume:   74.70 ml  36.85 ml/m LA Vol (A4C): 40.7 ml 20.08 ml/m  AORTIC VALVE AV Area (Vmax):    2.85 cm AV Area (Vmean):   3.27 cm AV Area (VTI):     2.96 cm AV Vmax:           117.00 cm/s AV Vmean:          77.000 cm/s AV VTI:            0.173 m AV Peak Grad:      5.5 mmHg AV Mean Grad:      3.0 mmHg LVOT Vmax:         106.00 cm/s LVOT  Vmean:        80.100 cm/s LVOT VTI:          0.163 m LVOT/AV VTI ratio: 0.94  AORTA Ao Root diam: 3.20 cm Ao Asc diam:  3.40 cm MITRAL VALVE MV Area (PHT): 5.23 cm    SHUNTS MV Decel Time: 145 msec    Systemic VTI:  0.16 m MV E velocity: 58.20 cm/s  Systemic Diam: 2.00 cm MV A velocity: 86.80 cm/s MV E/A ratio:  0.67 Charlton Haws MD Electronically signed by Charlton Haws MD Signature Date/Time: 09/26/2020/11:48:16 AM    Final      PHYSICAL EXAM  Temp:  [98 F (36.7 C)-99.1 F (37.3 C)] 98 F (36.7 C) (06/16 0751) Pulse Rate:  [78-87] 86 (06/16 1129) Resp:  [16-18] 18 (06/16 0450) BP: (126-139)/(69-84) 139/84 (06/16 1129) SpO2:  [97 %-100 %] 100 % (06/16 1129) Weight:  [88.9 kg] 88.9 kg (06/16 0500)  General - Well nourished, well developed, in no apparent distress.  Ophthalmologic - fundi not visualized due to noncooperation.  Cardiovascular - Regular rhythm and rate.  Mental Status -  Level of arousal and orientation to time, place, and person were intact. Language including expression, naming, repetition, comprehension was assessed and found intact. Registration 3/3, delayed recall 1/3. Fund of Knowledge was assessed and was intact.  Cranial Nerves II - XII - II - Visual field intact OU. III, IV, VI - Extraocular movements intact. V - Facial sensation intact bilaterally. VII - Facial movement intact bilaterally. VIII - Hearing & vestibular intact bilaterally. X - Palate elevates symmetrically. XI - Chin turning & shoulder shrug intact bilaterally. XII - Tongue protrusion intact.  Motor Strength - The patient's strength was normal in all extremities and pronator drift was absent.  Bulk was normal and fasciculations were absent.   Motor Tone - Muscle tone was assessed at the neck and appendages and was normal.  Reflexes - The patient's reflexes were symmetrical in all extremities and she had no pathological reflexes.  Sensory - Light touch, temperature/pinprick were assessed  and were symmetrical.    Coordination - The patient had normal movements in the hands and feet with no ataxia or dysmetria.  Tremor was absent   Gait and Station - deferred.   ASSESSMENT/PLAN Sabrina Ellis is a 65 y.o. female with history of diabetes, memory difficulty admitted for chest pain with diagnosis of STEMI, status post cardiac cath found to have RCA occlusion, and successful with stenting, planning for CABG.  Given short-term memory difficulty, MRI was done which showed scattered infarcts, neurology was consulted.   Stroke: Scattered small/punctate bilateral cerebellum and right posterior parietal infarcts, embolic pattern, source unclear, DDx including  STEMI-related vs. Cardia cath related vs. Occult afib MRI  scattered small/punctate bilateral cerebellum and right posterior parietal infarcts CTA head and neck pending Recommend 30-day cardiac event monitoring as outpatient to rule out A. fib 2D Echo EF 40 to 45% LDL 155 ->120 HgbA1c 14.2 SCDs for VTE prophylaxis  No antithrombotic prior to admission, now on aspirin 81 mg daily. Agree with DAPT after CABG procedure.  Patient counseled to be compliant with her antithrombotic medications Ongoing aggressive stroke risk factor management  STEMI Cardiology on board Cardiac cath showed RCA occlusion with unsuccessful stenting Plan for CABG On aspirin 81.  Plavix on hold for CABG. Agree with DAPT after CABG.  Diabetes HgbA1c 14.2 goal < 7.0 Uncontrolled Currently on lantus  CBG monitoring SSI DM education and close PCP follow up  Hypertension stable Long term BP goal normotensive  Hyperlipidemia Home meds:  none  LDL 155->120, goal < 70 Now on lipitor 80 Continue statin at discharge  Memory difficulty Per pt, she has short term memory issue for about a year and getting worse Still most consistent with mild cognitive impairment (MCI) Dementia labs including TSH, free T4, RPR, HIV, B12  unremarkable Uncontrolled diabetes, CAD, stroke could be contributing factors for vascular dementia Patient needs outpatient referral for neuropsych testing  Other Stroke Risk Factors   Other Active Problems Hyponatremia sodium 131-> 136    Hospital day # 6   Marvel Plan, MD PhD Stroke Neurology 10/01/2020 2:22 PM    To contact Stroke Continuity provider, please refer to [email or web address]. After hours, contact General Neurology

## 2020-10-02 DIAGNOSIS — I5022 Chronic systolic (congestive) heart failure: Secondary | ICD-10-CM | POA: Diagnosis not present

## 2020-10-02 DIAGNOSIS — I2119 ST elevation (STEMI) myocardial infarction involving other coronary artery of inferior wall: Secondary | ICD-10-CM | POA: Diagnosis not present

## 2020-10-02 DIAGNOSIS — I6340 Cerebral infarction due to embolism of unspecified cerebral artery: Secondary | ICD-10-CM | POA: Diagnosis not present

## 2020-10-02 DIAGNOSIS — E0800 Diabetes mellitus due to underlying condition with hyperosmolarity without nonketotic hyperglycemic-hyperosmolar coma (NKHHC): Secondary | ICD-10-CM | POA: Diagnosis not present

## 2020-10-02 DIAGNOSIS — I2111 ST elevation (STEMI) myocardial infarction involving right coronary artery: Secondary | ICD-10-CM | POA: Diagnosis not present

## 2020-10-02 LAB — GLUCOSE, CAPILLARY
Glucose-Capillary: 153 mg/dL — ABNORMAL HIGH (ref 70–99)
Glucose-Capillary: 123 mg/dL — ABNORMAL HIGH (ref 70–99)

## 2020-10-02 MED ORDER — TRANEXAMIC ACID 1000 MG/10ML IV SOLN
1.5000 mg/kg/h | INTRAVENOUS | Status: DC
  Filled 2020-10-02: qty 25

## 2020-10-02 MED ORDER — PHENYLEPHRINE HCL-NACL 20-0.9 MG/250ML-% IV SOLN
30.0000 ug/min | INTRAVENOUS | Status: DC
Start: 2020-10-05 — End: 2020-10-02
  Filled 2020-10-02: qty 250

## 2020-10-02 MED ORDER — CEFAZOLIN SODIUM-DEXTROSE 2-4 GM/100ML-% IV SOLN
2.0000 g | INTRAVENOUS | Status: DC
  Filled 2020-10-02: qty 100

## 2020-10-02 MED ORDER — SODIUM CHLORIDE 0.9 % IV SOLN
INTRAVENOUS | Status: DC
  Filled 2020-10-02: qty 30

## 2020-10-02 MED ORDER — TRANEXAMIC ACID (OHS) BOLUS VIA INFUSION
15.0000 mg/kg | INTRAVENOUS | Status: DC
  Filled 2020-10-02: qty 1335

## 2020-10-02 MED ORDER — INSULIN REGULAR(HUMAN) IN NACL 100-0.9 UT/100ML-% IV SOLN
INTRAVENOUS | Status: DC
  Filled 2020-10-02: qty 100

## 2020-10-02 MED ORDER — POTASSIUM CHLORIDE 2 MEQ/ML IV SOLN
80.0000 meq | INTRAVENOUS | Status: DC
  Filled 2020-10-02: qty 40

## 2020-10-02 MED ORDER — EPINEPHRINE HCL 5 MG/250ML IV SOLN IN NS
0.0000 ug/min | INTRAVENOUS | Status: DC
  Filled 2020-10-02: qty 250

## 2020-10-02 MED ORDER — MAGNESIUM SULFATE 50 % IJ SOLN
40.0000 meq | INTRAMUSCULAR | Status: DC
  Filled 2020-10-02: qty 9.85

## 2020-10-02 MED ORDER — VANCOMYCIN HCL 1500 MG/300ML IV SOLN
1500.0000 mg | INTRAVENOUS | Status: DC
  Filled 2020-10-02: qty 300

## 2020-10-02 MED ORDER — DEXMEDETOMIDINE HCL IN NACL 400 MCG/100ML IV SOLN
0.1000 ug/kg/h | INTRAVENOUS | Status: DC
  Filled 2020-10-02: qty 100

## 2020-10-02 MED ORDER — TRANEXAMIC ACID (OHS) PUMP PRIME SOLUTION
2.0000 mg/kg | INTRAVENOUS | Status: DC
  Filled 2020-10-02: qty 1.78

## 2020-10-02 MED ORDER — NITROGLYCERIN IN D5W 200-5 MCG/ML-% IV SOLN
2.0000 ug/min | INTRAVENOUS | Status: DC
  Filled 2020-10-02: qty 250

## 2020-10-02 MED ORDER — NOREPINEPHRINE 4 MG/250ML-% IV SOLN
0.0000 ug/min | INTRAVENOUS | Status: DC
  Filled 2020-10-02: qty 250

## 2020-10-02 MED ORDER — MILRINONE LACTATE IN DEXTROSE 20-5 MG/100ML-% IV SOLN
0.3000 ug/kg/min | INTRAVENOUS | Status: DC
  Filled 2020-10-02: qty 100

## 2020-10-02 MED ORDER — PLASMA-LYTE A IV SOLN
INTRAVENOUS | Status: DC
  Filled 2020-10-02: qty 5

## 2020-10-02 NOTE — Plan of Care (Signed)
  Problem: Education: Goal: Understanding of CV disease, CV risk reduction, and recovery process will improve Outcome: Progressing Goal: Individualized Educational Video(s) Outcome: Progressing   Problem: Education: Goal: Individualized Educational Video(s) Outcome: Progressing   Problem: Activity: Goal: Ability to return to baseline activity level will improve Outcome: Progressing   Problem: Cardiovascular: Goal: Ability to achieve and maintain adequate cardiovascular perfusion will improve Outcome: Progressing Goal: Vascular access site(s) Level 0-1 will be maintained Outcome: Progressing   Problem: Health Behavior/Discharge Planning: Goal: Ability to safely manage health-related needs after discharge will improve Outcome: Progressing

## 2020-10-02 NOTE — Progress Notes (Signed)
[facility] PROGRESS NOTE    Sabrina Ellis  ZOX:[id number] DOB: 1955-07-02 DOA: 09/25/2020 PCP: Pcp, No     Brief Narrative:  Sabrina Ellis is a 65 year old female with history of diabetes mellitus type 2, who does not follow with PCP, who came to hospital with episodic chest pain for past 2 to 3 months.  She was taken to [facility] where EKG showed ST elevation in leads 2, 3, aVF.  She was brought to [facility] for evaluation for cardiac catheterization. Cardiac cath showed inferior STEMI secondary to occlusion of large, dominant RCA proximally.  PCI was unsuccessful.  Patient also was found to have moderate to severe proximal LAD stenosis.  CABG was recommended.  Hospitalization further complicated by patient's underlying psych issues, memory loss.  Due to her memory loss, MRI brain was completed which revealed acute ischemia.  Neurology consulted for stroke work-up.  New events last 24 hours / Subjective: No physical complaints, chest pain continues to be very mild in nature.  Has been able to participate with blood sugar checks and insulin administration.  Assessment & Plan:   Active Problems:   STEMI (ST elevation myocardial infarction) (HCC)   Acute ST elevation myocardial infarction (STEMI) of inferior wall (HCC)   Cerebral thrombosis with cerebral infarction   Cerebral embolism with cerebral infarction   Diabetes mellitus due to underlying condition with hyperosmolarity without coma, without long-term current use of insulin (HCC)   Goals of care, counseling/discussion   STEMI -Cardiac cath showed occluded large dominant RCA in the proximal segment, unsuccessful attempted PCI -Also showed moderate to severe proximal LAD stenosis, severe mid circumflex stenosis -Cardiology, cardiothoracic surgery following -Continue aspirin, Lipitor, metoprolol -CABG work-up with tentative plans for OR Monday   Ischemic cardiomyopathy -Continue metoprolol, spironolactone,  Farxiga, imdur   CVA -MRI brain: Small foci of acute ischemia within both cerebellar hemispheres and right parietal lobe. No hemorrhage or mass effect. Findings of chronic microvascular ischemia. -Neurology signed off 6/17 -CTA head and neck unremarkable -Continue Lipitor -Continue aspirin, okay with DAPT after CABG -Recommend 30-day cardiac event monitoring as outpatient to rule out A. Fib -Follow-up with stroke clinic in 4 weeks  Short term memory loss -RPR NR -HIV NR -Vitamin B12 within normal  Depression/anxiety  -Psych following -Started on atarax   DM 2, uncontrolled with hyperglycemia -Continue Lantus, sliding scale insulin -Diabetic coordinator, dietitian consult  Hyperlipidemia -Continue Lipitor   DVT prophylaxis:  Place and maintain sequential compression device Start: 09/29/20 1555 SCD's Start: 09/25/20 1836  Code Status:     Code Status Orders  (From admission, onward)           Start     Ordered   09/29/20 1302  Limited resuscitation (code)  Continuous       Question Answer Comment  In the event of cardiac or respiratory ARREST: Initiate Code Blue, Call Rapid Response Yes   In the event of cardiac or respiratory ARREST: Perform CPR Yes   In the event of cardiac or respiratory ARREST: Perform Intubation/Mechanical Ventilation Yes   In the event of cardiac or respiratory ARREST: Use NIPPV/BiPAp only if indicated Yes   In the event of cardiac or respiratory ARREST: Administer ACLS medications if indicated Yes   In the event of cardiac or respiratory ARREST: Perform Defibrillation or Cardioversion if indicated Yes   Comments Patient would like all resucitation efforts performed but would not like to be maintained on machines for a prolonged period of time or prolonged resuscitation  if deemed unlikely to have meaningful recovery      09/29/20 1303           Code Status History     Date Active Date Inactive Code Status Order ID Comments User  Context   09/25/2020 2252 09/29/2020 1301 DNR [id number]  Marykay Lex, MD Inpatient   09/25/2020 1839 09/25/2020 2252 Full Code [id number]  Isaiah Serge, NP Inpatient   09/25/2020 1839 09/25/2020 1839 Full Code [id number]  Burnell Blanks, MD Inpatient      Family Communication: None at bedside  Disposition Plan:  Status is: Inpatient  Remains inpatient appropriate because:Ongoing diagnostic testing needed not appropriate for outpatient work up  Dispo: The patient is from: Home              Anticipated d/c is to: SNF              Patient currently is not medically stable to d/c.   Difficult to place patient No      Antimicrobials:  Anti-infectives (From admission, onward)    Start     Dose/Rate Route Frequency Ordered Stop   10/05/20 0400  vancomycin (VANCOREADY) IVPB 1500 mg/300 mL        1,500 mg 150 mL/hr over 120 Minutes Intravenous To Surgery 10/02/20 0934 10/06/20 0400   10/05/20 0400  ceFAZolin (ANCEF) IVPB 2g/100 mL premix        2 g 200 mL/hr over 30 Minutes Intravenous To Surgery 10/02/20 0934 10/06/20 0400   10/05/20 0400  ceFAZolin (ANCEF) IVPB 2g/100 mL premix        2 g 200 mL/hr over 30 Minutes Intravenous To Surgery 10/02/20 0934 10/06/20 0400        Objective: Vitals:   10/01/20 2012 10/02/20 0456 10/02/20 0826 10/02/20 1223  BP: (!) 153/81 123/64 116/65 108/73  Pulse: 94 68 91 (!) 115  Resp: 18 18    Temp: (!) 97.5 F (36.4 C) 98.7 F (37.1 C)    TempSrc: Oral Oral    SpO2: 98% 98% 99% 97%  Weight:  89 kg    Height:        Intake/Output Summary (Last 24 hours) at 10/02/2020 1323 Last data filed at 10/02/2020 0510 Gross per 24 hour  Intake 360 ml  Output 450 ml  Net -90 ml    Filed Weights   09/30/20 0315 10/01/20 0500 10/02/20 0456  Weight: 88.8 kg 88.9 kg 89 kg  Examination: General exam: Appears calm and comfortable  Respiratory system: Clear to auscultation. Respiratory effort normal. Cardiovascular system: S1 & S2 heard,  RRR. No pedal edema. Gastrointestinal system: Abdomen is nondistended, soft and nontender. Normal bowel sounds heard. Central nervous system: Alert and oriented. Non focal exam. Speech clear  Extremities: Symmetric in appearance bilaterally  Skin: No rashes, lesions or ulcers on exposed skin  Psychiatry: Remains stable  Data Reviewed: I have personally reviewed following labs and imaging studies  CBC: Recent Labs  Lab 09/25/20 1702 09/25/20 1709 09/26/20 0243 09/30/20 0341  WBC  --  7.2  7.2 7.2 6.6  NEUTROABS  --  4.0  --   --   HGB 14.6 14.0  14.0 12.4 12.7  HCT 43.0 42.2  41.7 37.2 39.1  MCV  --  88.7  88.5 89.0 90.5  PLT  --  284  280 267 419    Basic Metabolic Panel: Recent Labs  Lab 09/25/20 1702 09/25/20 1709 09/25/20 1823 09/26/20 0243 09/30/20 0341  NA 133*  133*  --  131* 136  K 3.8 3.3*  --  4.1 3.5  CL 95* 96*  --  97* 101  CO2  --  24  --  25 26  GLUCOSE 562* 477*  --  433* 239*  BUN 10 9  --  7* 12  CREATININE 0.70 0.85  --  0.73 0.91  CALCIUM  --  9.7  --  9.0 9.7  MG  --   --  2.1  --   --     GFR: Estimated Creatinine Clearance: 72.9 mL/min (by C-G formula based on SCr of 0.91 mg/dL). Liver Function Tests: Recent Labs  Lab 09/25/20 1709 09/30/20 0341  AST 25 21  ALT 15 16  ALKPHOS 68 64  BILITOT 0.7 0.5  PROT 7.8 7.7  ALBUMIN 3.7 3.5    No results for input(s): LIPASE, AMYLASE in the last 168 hours. No results for input(s): AMMONIA in the last 168 hours. Coagulation Profile: Recent Labs  Lab 09/25/20 2152  INR 1.1    Cardiac Enzymes: No results for input(s): CKTOTAL, CKMB, CKMBINDEX, TROPONINI in the last 168 hours. BNP (last 3 results) No results for input(s): PROBNP in the last 8760 hours. HbA1C: No results for input(s): HGBA1C in the last 72 hours. CBG: Recent Labs  Lab 10/01/20 1128 10/01/20 1713 10/01/20 2148 10/02/20 0616 10/02/20 1123  GLUCAP 215* 170* 171* 153* 123*    Lipid Profile: No results for  input(s): CHOL, HDL, LDLCALC, TRIG, CHOLHDL, LDLDIRECT in the last 72 hours. Thyroid Function Tests: No results for input(s): TSH, T4TOTAL, FREET4, T3FREE, THYROIDAB in the last 72 hours. Anemia Panel: Recent Labs    09/29/20 1608  VITAMINB12 841    Sepsis Labs: No results for input(s): PROCALCITON, LATICACIDVEN in the last 168 hours.  Recent Results (from the past 240 hour(s))  Resp Panel by RT-PCR (Flu A&B, Covid) Nasopharyngeal Swab     Status: None   Collection Time: 09/25/20  4:43 PM   Specimen: Nasopharyngeal Swab; Nasopharyngeal(NP) swabs in vial transport medium  Result Value Ref Range Status   SARS Coronavirus 2 by RT PCR NEGATIVE NEGATIVE Final    Comment: (NOTE) SARS-CoV-2 target nucleic acids are NOT DETECTED.  The SARS-CoV-2 RNA is generally detectable in upper respiratory specimens during the acute phase of infection. The lowest concentration of SARS-CoV-2 viral copies this assay can detect is 138 copies/mL. A negative result does not preclude SARS-Cov-2 infection and should not be used as the sole basis for treatment or other patient management decisions. A negative result may occur with  improper specimen collection/handling, submission of specimen other than nasopharyngeal swab, presence of viral mutation(s) within the areas targeted by this assay, and inadequate number of viral copies(<138 copies/mL). A negative result must be combined with clinical observations, patient history, and epidemiological information. The expected result is Negative.  Fact Sheet for Patients:  EntrepreneurPulse.com.au  Fact Sheet for Healthcare Providers:  IncredibleEmployment.be  This test is no t yet approved or cleared by the [place] FDA and  has been authorized for detection and/or diagnosis of SARS-CoV-2 by FDA under an Emergency Use Authorization (EUA). This EUA will remain  in effect (meaning this test can be used) for the  duration of the COVID-19 declaration under Section 564(b)(1) of the Act, 21 U.S.C.section 360bbb-3(b)(1), unless the authorization is terminated  or revoked sooner.       Influenza A by PCR NEGATIVE NEGATIVE Final   Influenza B by PCR NEGATIVE NEGATIVE Final  Comment: (NOTE) The Xpert Xpress SARS-CoV-2/FLU/RSV plus assay is intended as an aid in the diagnosis of influenza from Nasopharyngeal swab specimens and should not be used as a sole basis for treatment. Nasal washings and aspirates are unacceptable for Xpert Xpress SARS-CoV-2/FLU/RSV testing.  Fact Sheet for Patients: EntrepreneurPulse.com.au  Fact Sheet for Healthcare Providers: IncredibleEmployment.be  This test is not yet approved or cleared by the [place] FDA and has been authorized for detection and/or diagnosis of SARS-CoV-2 by FDA under an Emergency Use Authorization (EUA). This EUA will remain in effect (meaning this test can be used) for the duration of the COVID-19 declaration under Section 564(b)(1) of the Act, 21 U.S.C. section 360bbb-3(b)(1), unless the authorization is terminated or revoked.  Performed at [facility], [place] [address], [place], [place] [address]   MRSA PCR Screening     Status: None   Collection Time: 09/25/20  6:00 PM   Specimen: Nasopharyngeal  Result Value Ref Range Status   MRSA by PCR NEGATIVE NEGATIVE Final    Comment:        The GeneXpert MRSA Assay (FDA approved for NASAL specimens only), is one component of a comprehensive MRSA colonization surveillance program. It is not intended to diagnose MRSA infection nor to guide or monitor treatment for MRSA infections. Performed at [facility], [place] [address] [place], [place] [address]        Radiology Studies: CT ANGIO HEAD NECK W WO CM  Result Date: 10/01/2020 CLINICAL DATA:  Stroke EXAM: CT ANGIOGRAPHY HEAD AND NECK TECHNIQUE: Multidetector CT imaging of the head  and neck was performed using the standard protocol during bolus administration of intravenous contrast. Multiplanar CT image reconstructions and MIPs were obtained to evaluate the vascular anatomy. Carotid stenosis measurements (when applicable) are obtained utilizing NASCET criteria, using the distal internal carotid diameter as the denominator. CONTRAST:  38m OMNIPAQUE IOHEXOL 350 MG/ML SOLN COMPARISON:  MRI head 09/29/2020 FINDINGS: CT HEAD FINDINGS Brain: Small areas of acute infarct in the right parietal lobe and right cerebellum and left cerebellum are best identified on diffusion-weighted imaging. Negative for acute hemorrhage or mass. Mild white matter changes consistent with chronic microvascular ischemia. Vascular: Negative for hyperdense vessel Skull: Negative Sinuses: Negative Orbits: Negative Review of the MIP images confirms the above findings CTA NECK FINDINGS Aortic arch: Mild atherosclerotic calcification aortic arch. No aneurysm. Proximal great vessels widely patent Right carotid system: Mild atherosclerotic disease right carotid bifurcation without significant stenosis Left carotid system: Mild atherosclerotic disease left carotid bifurcation without stenosis. Vertebral arteries: Both vertebral arteries patent to the basilar without stenosis. Skeleton: No acute abnormality. Other neck: Multinodular goiter. Largest nodules include 13 mm right lower pole nodule and 13 mm cystic nodule left midpole. No further imaging necessary based on nodule size. Upper chest: Mild apical emphysema.  No acute abnormality. Review of the MIP images confirms the above findings CTA HEAD FINDINGS Anterior circulation: Internal carotid artery patent bilaterally. Anterior and middle cerebral arteries widely patent without stenosis or large vessel occlusion. Posterior circulation: Both vertebral arteries patent to the basilar. Distal left vertebral artery is hypoplastic. PICA patent bilaterally. Basilar widely patent.  AICA patent. Basilar ends in the superior cerebellar arteries bilaterally. Fetal origin of the posterior cerebral artery bilaterally without stenosis. Venous sinuses: Normal venous enhancement. Anatomic variants: None Review of the MIP images confirms the above findings IMPRESSION: 1. Acute infarcts in the cerebellum bilaterally and right parietal lobe best seen on diffusion-weighted imaging. Negative for hemorrhage. 2.  No intracranial large vessel occlusion 3. No significant carotid or vertebral artery stenosis in the neck. Electronically Signed   By: Franchot Gallo M.D.   On: 10/01/2020 17:11      Scheduled Meds:  aspirin EC  81 mg Oral Daily   atorvastatin  80 mg Oral Daily   Chlorhexidine Gluconate Cloth  6 each Topical Daily   colchicine  0.6 mg Oral BID   dapagliflozin propanediol  10 mg Oral Daily   [START ON 10/05/2020] epinephrine  0-10 mcg/min Intravenous To OR   [START ON 10/05/2020] heparin-papaverine-plasmalyte irrigation   Irrigation To OR   insulin aspart  0-5 Units Subcutaneous QHS   insulin aspart  0-9 Units Subcutaneous TID WC   insulin glargine  20 Units Subcutaneous QHS   [START ON 10/05/2020] insulin   Intravenous To OR   insulin starter kit- pen needles  1 kit Other Once   isosorbide mononitrate  30 mg Oral Daily   living well with diabetes book   Does not apply Once   [START ON 10/05/2020] magnesium sulfate  40 mEq Other To OR   metoprolol succinate  12.5 mg Oral Daily   [START ON 10/05/2020] phenylephrine  30-200 mcg/min Intravenous To OR   [START ON 10/05/2020] potassium chloride  80 mEq Other To OR   sodium chloride flush  3 mL Intravenous Q12H   spironolactone  12.5 mg Oral Daily   [START ON 10/05/2020] tranexamic acid  15 mg/kg Intravenous To OR   [START ON 10/05/2020] tranexamic acid  2 mg/kg Intracatheter To OR   Continuous Infusions:  sodium chloride     [START ON 10/05/2020]  ceFAZolin (ANCEF) IV     [START ON 10/05/2020]  ceFAZolin (ANCEF) IV     [START ON  10/05/2020] dexmedetomidine     [START ON 10/05/2020] heparin 30,000 units/NS 1000 mL solution for CELLSAVER     [START ON 10/05/2020] milrinone     [START ON 10/05/2020] nitroGLYCERIN     [START ON 10/05/2020] norepinephrine     [START ON 10/05/2020] tranexamic acid (CYKLOKAPRON) infusion ([facility])     [START ON 10/05/2020] vancomycin       LOS: 7 days      Time spent: 35 minutes   Dessa Phi, DO [facility] 10/02/2020, 1:23 PM   Available via Epic secure chat 7am-7pm After these hours, please refer to coverage provider listed on [email or web address]

## 2020-10-02 NOTE — Evaluation (Addendum)
Occupational Therapy Evaluation Patient Details Name: Sabrina Ellis MRN: [id number] DOB: September 29, 1955 Today's Date: 10/02/2020    History of Present Illness Pt is a 65 y.o. female admitted 09/25/20 wtih episodic chest pain for past 2-3 months; transfer to [facility] for cath. 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 Pt also found to have proximal LAD stenosis; CABG recommended. Hospitalization complicated by pt's underlying psych issues, memory loss. Psych consulted for anxiety, trypanophobia (fear of needles). PMH includes tobacco use.   Clinical Impression   PTA, pt lives alone and reports Independence with ADLs, IADLs (including driving) and mobility without AD. Pt does endorses memory deficits and increasing difficulty with tasks. Sternal precautions encouraged during session to prepare pt for pending CABG. Pt overall min guard for mobility using RW, Independent with UB ADLs and Setup for LB ADLs. Due to memory deficits and lack of support at DC, anticipate pt would benefit from postacute rehab for reinforcement of sternal precautions/safety during ADLs/IADLs. Will continue to follow acutely and re-assess s/p CABG.     Follow Up Recommendations  SNF;Supervision - Intermittent    Equipment Recommendations  None recommended by OT    Recommendations for Other Services       Precautions / Restrictions Precautions Precautions: Fall;Other (comment) Precaution Booklet Issued: No Precaution Comments: memory deficits Restrictions Weight Bearing Restrictions: No Other Position/Activity Restrictions: practicing sternal precautions in prep for CABG      Mobility Bed Mobility Overal bed mobility: Needs Assistance Bed Mobility: Rolling;Supine to Sit Rolling: Min assist;Supervision (supervision without sternal precautions, minA with precautions)   Supine to sit: Min guard     General bed mobility comments: up in chair    Transfers Overall transfer level:  Needs assistance Equipment used: None Transfers: Sit to/from Stand Sit to Stand: Min guard         General transfer comment: min guard for safety, cues for sternal precautions in prep for procedure next week    Balance Overall balance assessment: Needs assistance Sitting-balance support: No upper extremity supported;Feet supported Sitting balance-Leahy Scale: Good     Standing balance support: No upper extremity supported Standing balance-Leahy Scale: Fair                             ADL either performed or assessed with clinical judgement   ADL Overall ADL's : Needs assistance/impaired Eating/Feeding: Independent;Sitting   Grooming: Standing;Independent   Upper Body Bathing: Independent;Sitting Upper Body Bathing Details (indicate cue type and reason): bathing primarily under breasts, armpits Lower Body Bathing: Set up;Sit to/from stand Lower Body Bathing Details (indicate cue type and reason): Setup to bathe peri region, placed chair at sink due to fatigue Upper Body Dressing : Independent;Sitting   Lower Body Dressing: Set up;Sit to/from stand Lower Body Dressing Details (indicate cue type and reason): Setup to don mesh underwear Toilet Transfer: Min guard;Ambulation;RW   Toileting- Clothing Manipulation and Hygiene: Set up;Sit to/from stand       Functional mobility during ADLs: Min guard;Rolling walker;Cueing for sequencing;Cueing for safety General ADL Comments: Pt's memory deficits hinder safety with daily tasks. Pending CABG next week, with difficulties recalling sternal precautions in prep for procedure may require increased assist for tasks     Vision Baseline Vision/History: Wears glasses Wears Glasses: At all times Patient Visual Report: No change from baseline Vision Assessment?: No apparent visual deficits     Perception     Praxis      Pertinent  Vitals/Pain Pain Assessment: Faces Faces Pain Scale: Hurts little more Pain Location:  chest Pain Descriptors / Indicators: Grimacing Pain Intervention(s): Monitored during session     Hand Dominance Right   Extremity/Trunk Assessment Upper Extremity Assessment Upper Extremity Assessment: Overall WFL for tasks assessed   Lower Extremity Assessment Lower Extremity Assessment: Defer to PT evaluation   Cervical / Trunk Assessment Cervical / Trunk Assessment: Normal   Communication Communication Communication: No difficulties;Other (comment) (hyperverbose)   Cognition Arousal/Alertness: Awake/alert Behavior During Therapy: Impulsive Overall Cognitive Status: Impaired/Different from baseline Area of Impairment: Problem solving;Memory;Attention                   Current Attention Level: Sustained Memory: Decreased short-term memory;Decreased recall of precautions       Problem Solving: Requires verbal cues;Requires tactile cues General Comments: endorses hx of memory deficits, compensates by writing everything down though still difficulies remembering staff, medical complexities, etc. Hyperverbose with difficulty attending to tasks   General Comments  VSS on RA    Exercises Exercises: General Lower Extremity General Exercises - Lower Extremity Ankle Circles/Pumps: AROM;Both;10 reps Long Arc Quad: AROM;Both;10 reps Hip ABduction/ADduction: AROM;Both;10 reps   Shoulder Instructions      Home Living Family/patient expects to be discharged to:: Private residence Living Arrangements: Alone Available Help at Discharge:  (none identified) Type of Home: House Home Access: Ramped entrance     Home Layout: One level     Bathroom Shower/Tub: Chief Strategy Officer: Standard     Home Equipment: Cane - single point;Grab bars - tub/shower;Shower seat          Prior Functioning/Environment Level of Independence: Needs assistance  Gait / Transfers Assistance Needed: ambulatory with cane or furniture ADL's / Homemaking Assistance Needed:  Pt able to complete ADLs though difficult. Pt reports not taking prescription meds, so no use of pill box. Pt reports cooking, cleaning and driving though reports increased difficulty and safety concerns due to memory deficits. Pt endorses having left stove on and fell asleep before, etc            OT Problem List: Decreased strength;Decreased activity tolerance;Impaired balance (sitting and/or standing);Decreased cognition;Decreased safety awareness      OT Treatment/Interventions: Self-care/ADL training;Therapeutic exercise;Energy conservation;DME and/or AE instruction;Therapeutic activities;Patient/family education;Balance training    OT Goals(Current goals can be found in the care plan section) Acute Rehab OT Goals Patient Stated Goal: to return to independence after CABG OT Goal Formulation: With patient Time For Goal Achievement: 10/16/20 Potential to Achieve Goals: Good  OT Frequency: Min 2X/week   Barriers to D/C:            Co-evaluation              AM-PAC OT "6 Clicks" Daily Activity     Outcome Measure Help from another person eating meals?: None Help from another person taking care of personal grooming?: A Little Help from another person toileting, which includes using toliet, bedpan, or urinal?: A Little Help from another person bathing (including washing, rinsing, drying)?: A Little Help from another person to put on and taking off regular upper body clothing?: None Help from another person to put on and taking off regular lower body clothing?: A Little 6 Click Score: 20   End of Session Equipment Utilized During Treatment: Gait belt;Rolling walker Nurse Communication: Mobility status;Other (comment) (pillowcase under abdominal fold for moisture wicking properties)  Activity Tolerance: Patient tolerated treatment well Patient left: in chair;with call bell/phone within  reach;with chair alarm set  OT Visit Diagnosis: Unsteadiness on feet (R26.81);Muscle  weakness (generalized) (M62.81);Other symptoms and signs involving cognitive function                Time: 7673-4193 OT Time Calculation (min): 46 min Charges:  OT General Charges $OT Visit: 1 Visit OT Evaluation $OT Eval Moderate Complexity: 1 Mod OT Treatments $Self Care/Home Management : 23-37 mins  Bradd Canary, OTR/L Acute Rehab Services Office: [phone]   Lorre Munroe 10/02/2020, 12:20 PM

## 2020-10-02 NOTE — Plan of Care (Signed)
  Problem: Education: Goal: Understanding of CV disease, CV risk reduction, and recovery process will improve Outcome: Progressing   Problem: Activity: Goal: Ability to return to baseline activity level will improve Outcome: Progressing   Problem: Cardiovascular: Goal: Ability to achieve and maintain adequate cardiovascular perfusion will improve Outcome: Progressing   Problem: Cardiovascular: Goal: Vascular access site(s) Level 0-1 will be maintained Outcome: Progressing

## 2020-10-02 NOTE — Progress Notes (Signed)
Patient could not be found in her room. Staff members went outside to try to find the patient. Cardiology, Bjorn Loser, was notified. Patient was found by the employee entrance downstairs. She states "I will not go back upstairs; I am going home. I am not getting anything out of this. Thank you for what you have done for me but I am going home." Staff unable to convince her to return to room or speak with MD. IV removed from patient's arm. Taxi number given to patient to call.

## 2020-10-02 NOTE — Progress Notes (Signed)
STROKE TEAM PROGRESS NOTE   SUBJECTIVE (INTERVAL HISTORY) No family is at the bedside. Pt sitting in chair for lunch. No acute event overnight, no neuro change. CTA head and neck negative.  Continue aspirin now and plan for CABG soon.   OBJECTIVE Temp:  [97.5 F (36.4 C)-98.7 F (37.1 C)] 98.7 F (37.1 C) (06/17 0456) Pulse Rate:  [68-94] 91 (06/17 0826) Cardiac Rhythm: Sinus bradycardia (06/17 0730) Resp:  [18] 18 (06/17 0456) BP: (116-153)/(64-81) 116/65 (06/17 0826) SpO2:  [98 %-99 %] 99 % (06/17 0826) Weight:  [89 kg] 89 kg (06/17 0456)  Recent Labs  Lab 10/01/20 1128 10/01/20 1713 10/01/20 2148 10/02/20 0616 10/02/20 1123  GLUCAP 215* 170* 171* 153* 123*   Recent Labs  Lab 09/25/20 1702 09/25/20 1709 09/25/20 1823 09/26/20 0243 09/30/20 0341  NA 133* 133*  --  131* 136  K 3.8 3.3*  --  4.1 3.5  CL 95* 96*  --  97* 101  CO2  --  24  --  25 26  GLUCOSE 562* 477*  --  433* 239*  BUN 10 9  --  7* 12  CREATININE 0.70 0.85  --  0.73 0.91  CALCIUM  --  9.7  --  9.0 9.7  MG  --   --  2.1  --   --    Recent Labs  Lab 09/25/20 1709 09/30/20 0341  AST 25 21  ALT 15 16  ALKPHOS 68 64  BILITOT 0.7 0.5  PROT 7.8 7.7  ALBUMIN 3.7 3.5   Recent Labs  Lab 09/25/20 1702 09/25/20 1709 09/26/20 0243 09/30/20 0341  WBC  --  7.2  7.2 7.2 6.6  NEUTROABS  --  4.0  --   --   HGB 14.6 14.0  14.0 12.4 12.7  HCT 43.0 42.2  41.7 37.2 39.1  MCV  --  88.7  88.5 89.0 90.5  PLT  --  284  280 267 338   No results for input(s): CKTOTAL, CKMB, CKMBINDEX, TROPONINI in the last 168 hours. No results for input(s): LABPROT, INR in the last 72 hours. No results for input(s): COLORURINE, LABSPEC, PHURINE, GLUCOSEU, HGBUR, BILIRUBINUR, KETONESUR, PROTEINUR, UROBILINOGEN, NITRITE, LEUKOCYTESUR in the last 72 hours.  Invalid input(s): APPERANCEUR     Component Value Date/Time   CHOL 186 09/26/2020 0243   TRIG 209 (H) 09/26/2020 0243   HDL 24 (L) 09/26/2020 0243   CHOLHDL 7.8  09/26/2020 0243   VLDL 42 (H) 09/26/2020 0243   LDLCALC 120 (H) 09/26/2020 0243   Lab Results  Component Value Date   HGBA1C 14.2 (H) 09/25/2020   No results found for: LABOPIA, COCAINSCRNUR, LABBENZ, AMPHETMU, THCU, LABBARB  No results for input(s): ETH in the last 168 hours.  I have personally reviewed the radiological images below and agree with the radiology interpretations.  CT ANGIO HEAD NECK W WO CM  Result Date: 10/01/2020 CLINICAL DATA:  Stroke EXAM: CT ANGIOGRAPHY HEAD AND NECK TECHNIQUE: Multidetector CT imaging of the head and neck was performed using the standard protocol during bolus administration of intravenous contrast. Multiplanar CT image reconstructions and MIPs were obtained to evaluate the vascular anatomy. Carotid stenosis measurements (when applicable) are obtained utilizing NASCET criteria, using the distal internal carotid diameter as the denominator. CONTRAST:  50mL OMNIPAQUE IOHEXOL 350 MG/ML SOLN COMPARISON:  MRI head 09/29/2020 FINDINGS: CT HEAD FINDINGS Brain: Small areas of acute infarct in the right parietal lobe and right cerebellum and left cerebellum are best identified on diffusion-weighted  imaging. Negative for acute hemorrhage or mass. Mild white matter changes consistent with chronic microvascular ischemia. Vascular: Negative for hyperdense vessel Skull: Negative Sinuses: Negative Orbits: Negative Review of the MIP images confirms the above findings CTA NECK FINDINGS Aortic arch: Mild atherosclerotic calcification aortic arch. No aneurysm. Proximal great vessels widely patent Right carotid system: Mild atherosclerotic disease right carotid bifurcation without significant stenosis Left carotid system: Mild atherosclerotic disease left carotid bifurcation without stenosis. Vertebral arteries: Both vertebral arteries patent to the basilar without stenosis. Skeleton: No acute abnormality. Other neck: Multinodular goiter. Largest nodules include 13 mm right lower  pole nodule and 13 mm cystic nodule left midpole. No further imaging necessary based on nodule size. Upper chest: Mild apical emphysema.  No acute abnormality. Review of the MIP images confirms the above findings CTA HEAD FINDINGS Anterior circulation: Internal carotid artery patent bilaterally. Anterior and middle cerebral arteries widely patent without stenosis or large vessel occlusion. Posterior circulation: Both vertebral arteries patent to the basilar. Distal left vertebral artery is hypoplastic. PICA patent bilaterally. Basilar widely patent. AICA patent. Basilar ends in the superior cerebellar arteries bilaterally. Fetal origin of the posterior cerebral artery bilaterally without stenosis. Venous sinuses: Normal venous enhancement. Anatomic variants: None Review of the MIP images confirms the above findings IMPRESSION: 1. Acute infarcts in the cerebellum bilaterally and right parietal lobe best seen on diffusion-weighted imaging. Negative for hemorrhage. 2. No intracranial large vessel occlusion 3. No significant carotid or vertebral artery stenosis in the neck. Electronically Signed   By: Marlan Palau M.D.   On: 10/01/2020 17:11   MR BRAIN WO CONTRAST  Result Date: 09/29/2020 CLINICAL DATA:  Memory loss EXAM: MRI HEAD WITHOUT CONTRAST TECHNIQUE: Multiplanar, multiecho pulse sequences of the brain and surrounding structures were obtained without intravenous contrast. COMPARISON:  None. FINDINGS: Brain: Small foci of abnormal diffusion restriction within both cerebellar hemispheres and in the right parietal lobe. Single focus of chronic microhemorrhage in the right temporal lobe. There is multifocal hyperintense T2-weighted signal within the white matter. Parenchymal volume and CSF spaces are normal. The midline structures are normal. Vascular: Major flow voids are preserved. Skull and upper cervical spine: Normal calvarium and skull base. Visualized upper cervical spine and soft tissues are normal.  Sinuses/Orbits:No paranasal sinus fluid levels or advanced mucosal thickening. No mastoid or middle ear effusion. Normal orbits. IMPRESSION: 1. Small foci of acute ischemia within both cerebellar hemispheres and right parietal lobe. No hemorrhage or mass effect. 2. Findings of chronic microvascular ischemia. Electronically Signed   By: Deatra Robinson M.D.   On: 09/29/2020 23:01   CARDIAC CATHETERIZATION  Result Date: 09/25/2020  Mid Cx lesion is 80% stenosed.  Prox LAD lesion is 70% stenosed.  Ost RCA to Dist RCA lesion is 100% stenosed.  1. Late presenting inferior ST elevation MI secondary to occlusion of the large, dominant RCA in the proximal segment. Unsuccessful attempt at PCI. There are left to right collaterals filling the distal RCA branches. 2. Moderately severe proximal LAD stenosis 3. Severe mid Circumflex stenosis Recommendations: She is pain free. Unable to perform PCI of the RCA as I could not pass a wire beyond the totally occluded proximal vessel. She is felt to have a late presenting inferior MI. Given three vessel CAD, will consider CABG as an option for revascularization. I will admit her to the ICU. Continue ASA/Brilinta for now. Will start a high intensity statin. Start a beta blocker. Echo in the am. CT surgery consultation this weekend to consider CABG.  We will call for consultation tomorrow and if she is felt to be a good candidate for bypass, would stop the Brilinta and start IV heparin. All labs pending at this time.   Portable chest x-ray 1 view  Result Date: 09/25/2020 CLINICAL DATA:  Chest pain EXAM: PORTABLE CHEST 1 VIEW COMPARISON:  None. FINDINGS: Heart and mediastinal contours are within normal limits. No focal opacities or effusions. No acute bony abnormality. IMPRESSION: No active disease. Electronically Signed   By: Charlett NoseKevin  Dover M.D.   On: 09/25/2020 20:09   ECHOCARDIOGRAM COMPLETE  Result Date: 09/26/2020    ECHOCARDIOGRAM REPORT   Patient Name:   Seleni  KENDRICK-COMER Date of Exam: 09/26/2020 Medical Rec #:  [id number]            Height:       68.0 in Accession #:    [id number]           Weight:       196.2 lb Date of Birth:  05/28/1955            BSA:          2.027 m Patient Age:    64 years             BP:           127/73 mmHg Patient Gender: F                    HR:           76 bpm. Exam Location:  Inpatient Procedure: 2D Echo, Cardiac Doppler, Color Doppler and Intracardiac            Opacification Agent Indications:    R07.9* Chest pain, unspecified  History:        Patient has no prior history of Echocardiogram examinations.                 Acute MI and CAD.  Sonographer:    Roosvelt Maserachel Lane RDCS Referring Phys: [id number] LAURA R INGOLD IMPRESSIONS  1. Poor quality images even with definity Markedly abnormal septal motion inferior wall hypokinesis . Left ventricular ejection fraction, by estimation, is 40 to 45%. The left ventricle has mildly decreased function. The left ventricle demonstrates regional wall motion abnormalities (see scoring diagram/findings for description). Left ventricular diastolic parameters were normal.  2. Right ventricular systolic function is normal. The right ventricular size is normal.  3. Right atrial size was mildly dilated.  4. The mitral valve is abnormal. Trivial mitral valve regurgitation. No evidence of mitral stenosis.  5. The aortic valve is tricuspid. There is mild calcification of the aortic valve. Aortic valve regurgitation is not visualized. Mild aortic valve sclerosis is present, with no evidence of aortic valve stenosis.  6. The inferior vena cava is normal in size with greater than 50% respiratory variability, suggesting right atrial pressure of 3 mmHg. FINDINGS  Left Ventricle: Poor quality images even with definity Markedly abnormal septal motion inferior wall hypokinesis. Left ventricular ejection fraction, by estimation, is 40 to 45%. The left ventricle has mildly decreased function. The left ventricle demonstrates  regional wall motion abnormalities. The left ventricular internal cavity size was normal in size. There is no left ventricular hypertrophy. Left ventricular diastolic parameters were normal. Right Ventricle: The right ventricular size is normal. No increase in right ventricular wall thickness. Right ventricular systolic function is normal. Left Atrium: Left atrial size was normal in size. Right Atrium: Right atrial size was mildly dilated. Pericardium:  There is no evidence of pericardial effusion. Mitral Valve: The mitral valve is abnormal. There is mild thickening of the mitral valve leaflet(s). Trivial mitral valve regurgitation. No evidence of mitral valve stenosis. Tricuspid Valve: The tricuspid valve is normal in structure. Tricuspid valve regurgitation is mild . No evidence of tricuspid stenosis. Aortic Valve: The aortic valve is tricuspid. There is mild calcification of the aortic valve. Aortic valve regurgitation is not visualized. Mild aortic valve sclerosis is present, with no evidence of aortic valve stenosis. Aortic valve mean gradient measures 3.0 mmHg. Aortic valve peak gradient measures 5.5 mmHg. Aortic valve area, by VTI measures 2.96 cm. Pulmonic Valve: The pulmonic valve was normal in structure. Pulmonic valve regurgitation is not visualized. No evidence of pulmonic stenosis. Aorta: The aortic root is normal in size and structure. Venous: The inferior vena cava is normal in size with greater than 50% respiratory variability, suggesting right atrial pressure of 3 mmHg. IAS/Shunts: No atrial level shunt detected by color flow Doppler.  LEFT VENTRICLE PLAX 2D LVIDd:         4.40 cm  Diastology LVIDs:         3.10 cm  LV e' medial:    6.64 cm/s LV PW:         1.00 cm  LV E/e' medial:  8.8 LV IVS:        1.10 cm  LV e' lateral:   11.50 cm/s LVOT diam:     2.00 cm  LV E/e' lateral: 5.1 LV SV:         51 LV SV Index:   25 LVOT Area:     3.14 cm  RIGHT VENTRICLE RV Basal diam:  4.70 cm RV Mid diam:     4.20 cm RV S prime:     7.29 cm/s TAPSE (M-mode): 0.9 cm LEFT ATRIUM           Index       RIGHT ATRIUM           Index LA diam:      2.60 cm 1.28 cm/m  RA Area:     20.60 cm LA Vol (A2C): 45.1 ml 22.25 ml/m RA Volume:   74.70 ml  36.85 ml/m LA Vol (A4C): 40.7 ml 20.08 ml/m  AORTIC VALVE AV Area (Vmax):    2.85 cm AV Area (Vmean):   3.27 cm AV Area (VTI):     2.96 cm AV Vmax:           117.00 cm/s AV Vmean:          77.000 cm/s AV VTI:            0.173 m AV Peak Grad:      5.5 mmHg AV Mean Grad:      3.0 mmHg LVOT Vmax:         106.00 cm/s LVOT Vmean:        80.100 cm/s LVOT VTI:          0.163 m LVOT/AV VTI ratio: 0.94  AORTA Ao Root diam: 3.20 cm Ao Asc diam:  3.40 cm MITRAL VALVE MV Area (PHT): 5.23 cm    SHUNTS MV Decel Time: 145 msec    Systemic VTI:  0.16 m MV E velocity: 58.20 cm/s  Systemic Diam: 2.00 cm MV A velocity: 86.80 cm/s MV E/A ratio:  0.67 Charlton Haws MD Electronically signed by Charlton Haws MD Signature Date/Time: 09/26/2020/11:48:16 AM    Final      PHYSICAL EXAM  Temp:  [97.5 F (36.4 C)-98.7 F (37.1 C)] 98.7 F (37.1 C) (06/17 0456) Pulse Rate:  [68-94] 91 (06/17 0826) Resp:  [18] 18 (06/17 0456) BP: (116-153)/(64-81) 116/65 (06/17 0826) SpO2:  [98 %-99 %] 99 % (06/17 0826) Weight:  [89 kg] 89 kg (06/17 0456)  General - Well nourished, well developed, in no apparent distress.  Ophthalmologic - fundi not visualized due to noncooperation.  Cardiovascular - Regular rhythm and rate.  Mental Status -  Level of arousal and orientation to time, place, and person were intact. Language including expression, naming, repetition, comprehension was assessed and found intact.  Cranial Nerves II - XII - II - Visual field intact OU. III, IV, VI - Extraocular movements intact. V - Facial sensation intact bilaterally. VII - Facial movement intact bilaterally. VIII - Hearing & vestibular intact bilaterally. X - Palate elevates symmetrically. XI - Chin turning & shoulder  shrug intact bilaterally. XII - Tongue protrusion intact.  Motor Strength - The patient's strength was normal in all extremities and pronator drift was absent.  Bulk was normal and fasciculations were absent.   Motor Tone - Muscle tone was assessed at the neck and appendages and was normal.  Reflexes - The patient's reflexes were symmetrical in all extremities and she had no pathological reflexes.  Sensory - Light touch, temperature/pinprick were assessed and were symmetrical.    Coordination - The patient had normal movements in the hands and feet with no ataxia or dysmetria.  Tremor was absent   Gait and Station - deferred.   ASSESSMENT/PLAN Ms. Janah Kendrick-Comer is a 65 y.o. female with history of diabetes, memory difficulty admitted for chest pain with diagnosis of STEMI, status post cardiac cath found to have RCA occlusion, and successful with stenting, planning for CABG.  Given short-term memory difficulty, MRI was done which showed scattered infarcts, neurology was consulted.   Stroke: Scattered small/punctate bilateral cerebellum and right posterior parietal infarcts, embolic pattern, source unclear, DDx including STEMI-related vs. Cardia cath related vs. Occult afib MRI  scattered small/punctate bilateral cerebellum and right posterior parietal infarcts CTA head and neck unremarkable Recommend 30-day cardiac event monitoring as outpatient to rule out A. fib 2D Echo EF 40 to 45% LDL 155 ->120 HgbA1c 14.2 SCDs for VTE prophylaxis  No antithrombotic prior to admission, now on aspirin 81 mg daily. Agree with DAPT after CABG procedure.  Patient counseled to be compliant with her antithrombotic medications Ongoing aggressive stroke risk factor management  STEMI Cardiology on board Cardiac cath showed RCA occlusion with unsuccessful stenting Plan for CABG On aspirin 81.  Plavix on hold for CABG. Agree with DAPT after CABG.  Diabetes HgbA1c 14.2 goal <  7.0 Uncontrolled Currently on lantus  CBG monitoring SSI DM education and close PCP follow up  Hypertension stable Long term BP goal normotensive  Hyperlipidemia Home meds:  none  LDL 155->120, goal < 70 Now on lipitor 80 Continue statin at discharge  Memory difficulty Per pt, she has short term memory issue for about a year and getting worse Still most consistent with mild cognitive impairment (MCI) Dementia labs including TSH, free T4, RPR, HIV, B12 unremarkable Uncontrolled diabetes, CAD, stroke could be contributing factors for vascular dementia Patient needs outpatient referral for neuropsych testing  Other Stroke Risk Factors   Other Active Problems Hyponatremia sodium 131-> 136    Hospital day # 7  Neurology will sign off. Please call with questions. Pt will follow up with stroke clinic NP at [facility] in  about 4 weeks. Thanks for the consult.   Marvel Plan, MD PhD Stroke Neurology 10/02/2020 11:57 AM    To contact Stroke Continuity provider, please refer to [email or web address]. After hours, contact General Neurology
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
## 2020-10-02 NOTE — Consult Note (Signed)
[facility] Face-to-Face Psychiatry Consult   Reason for Consult:  Concern for depression Referring Physician:  Lauree Chandler, MD Patient Identification: Sabrina Ellis MRN:  [id number] Principal Diagnosis: <principal problem not specified> Diagnosis:  Active Problems:   STEMI (ST elevation myocardial infarction) ([place])   Acute ST elevation myocardial infarction (STEMI) of inferior wall ([place])   Cerebral thrombosis with cerebral infarction   Cerebral embolism with cerebral infarction   Diabetes mellitus due to underlying condition with hyperosmolarity without coma, without long-term current use of insulin ([place])   Goals of care, counseling/discussion   Total Time spent with patient: 15 minutes  Subjective:   Sabrina Ellis is a 65 y.o. female patient admitted with STEMI and concern for depression during hospitalization.  HPI:  Overnight patient continued to be complaint with all of her medications and began attempting to self- administer her insulin. Diabetes coordinator noted that the patient was having some memory issues and required verbal cues, but was ultimately successful.  On assessment this AM patient is very happy about overcoming her fear. Provider notes that patient appears to be having more trouble with her memory today and talks to the patient about writing down notes each time a provider see's her. Patient agrees. Patient denies SI, HI, and AVH.   Past Psychiatric History: Patient reports 3 psych hospital stays when she was much younger and reports that they coincided with really bad menstruation but was told that her symptoms were "in my head" and she was later diagnosed with fibroids and had a hysterectomy. Patient reports she was on thorazine during at least one of these hospitalizations.    Risk to Self:  NO Risk to Others:NO   Prior Inpatient Therapy: YES  Prior Outpatient Therapy:  NO      Past Medical History:  Past Medical History:  Diagnosis Date    STEMI (ST elevation myocardial infarction) ([place]) 09/25/2020   Tobacco abuse stopped 5 yrs ago    Family History:  Family History  Problem Relation Age of Onset   Hypertension Mother    Family Psychiatric  History: Sister also had mood disorder that coincided with menstruation.  Social History:  Social History   Substance and Sexual Activity  Alcohol Use Not Currently     Social History   Substance and Sexual Activity  Drug Use Not Currently    Social History   Socioeconomic History   Marital status: Married    Spouse name: Not on file   Number of children: Not on file   Years of education: Not on file   Highest education level: Not on file  Occupational History   Not on file  Tobacco Use   Smoking status: Former    Years: 45.00    Pack years: 0.00    Types: Cigarettes    Quit date: 04/2015    Years since quitting: 5.4   Smokeless tobacco: Never  Substance and Sexual Activity   Alcohol use: Not Currently   Drug use: Not Currently   Sexual activity: Yes  Other Topics Concern   Not on file  Social History Narrative   Not on file   Social Determinants of Health   Financial Resource Strain: Not on file  Food Insecurity: Not on file  Transportation Needs: Not on file  Physical Activity: Not on file  Stress: Not on file  Social Connections: Not on file   Additional Social History:    Allergies:   Allergies  Allergen Reactions   Latex Swelling   Cashew  Nut Oil Itching and Rash   Tomato Itching and Rash    Labs:  Results for orders placed or performed during the hospital encounter of 09/25/20 (from the past 48 hour(s))  Glucose, capillary     Status: Abnormal   Collection Time: 09/30/20 11:19 AM  Result Value Ref Range   Glucose-Capillary 189 (H) 70 - 99 mg/dL    Comment: Glucose reference range applies only to samples taken after fasting for at least 8 hours.  Glucose, capillary     Status: Abnormal   Collection Time: 09/30/20  5:30 PM  Result Value  Ref Range   Glucose-Capillary 176 (H) 70 - 99 mg/dL    Comment: Glucose reference range applies only to samples taken after fasting for at least 8 hours.  Glucose, capillary     Status: Abnormal   Collection Time: 09/30/20  9:30 PM  Result Value Ref Range   Glucose-Capillary 249 (H) 70 - 99 mg/dL    Comment: Glucose reference range applies only to samples taken after fasting for at least 8 hours.  Glucose, capillary     Status: Abnormal   Collection Time: 10/01/20  5:38 AM  Result Value Ref Range   Glucose-Capillary 168 (H) 70 - 99 mg/dL    Comment: Glucose reference range applies only to samples taken after fasting for at least 8 hours.   Comment 1 Notify RN    Comment 2 Document in Chart   Glucose, capillary     Status: Abnormal   Collection Time: 10/01/20 11:28 AM  Result Value Ref Range   Glucose-Capillary 215 (H) 70 - 99 mg/dL    Comment: Glucose reference range applies only to samples taken after fasting for at least 8 hours.  Glucose, capillary     Status: Abnormal   Collection Time: 10/01/20  5:13 PM  Result Value Ref Range   Glucose-Capillary 170 (H) 70 - 99 mg/dL    Comment: Glucose reference range applies only to samples taken after fasting for at least 8 hours.  Glucose, capillary     Status: Abnormal   Collection Time: 10/01/20  9:48 PM  Result Value Ref Range   Glucose-Capillary 171 (H) 70 - 99 mg/dL    Comment: Glucose reference range applies only to samples taken after fasting for at least 8 hours.  Glucose, capillary     Status: Abnormal   Collection Time: 10/02/20  6:16 AM  Result Value Ref Range   Glucose-Capillary 153 (H) 70 - 99 mg/dL    Comment: Glucose reference range applies only to samples taken after fasting for at least 8 hours.    Current Facility-Administered Medications  Medication Dose Route Frequency Provider Last Rate Last Admin   0.9 %  sodium chloride infusion  250 mL Intravenous PRN Burnell Blanks, MD       acetaminophen (TYLENOL)  tablet 650 mg  650 mg Oral Q4H PRN Isaiah Serge, NP   650 mg at 09/26/20 0158   ALPRAZolam (XANAX) tablet 0.25 mg  0.25 mg Oral BID PRN Isaiah Serge, NP       aspirin EC tablet 81 mg  81 mg Oral Daily Isaiah Serge, NP   81 mg at 10/02/20 0931   atorvastatin (LIPITOR) tablet 80 mg  80 mg Oral Daily Isaiah Serge, NP   80 mg at 10/02/20 0931   [START ON 10/05/2020] ceFAZolin (ANCEF) IVPB 2g/100 mL premix  2 g Intravenous To OR Wonda Olds, MD       [  START ON 10/05/2020] ceFAZolin (ANCEF) IVPB 2g/100 mL premix  2 g Intravenous To OR Atkins, Glenice Bow, MD       Chlorhexidine Gluconate Cloth 2 % PADS 6 each  6 each Topical Daily Burnell Blanks, MD   6 each at 10/02/20 2992   colchicine tablet 0.6 mg  0.6 mg Oral BID Darreld Mclean, PA-C   0.6 mg at 10/02/20 4268   dapagliflozin propanediol (FARXIGA) tablet 10 mg  10 mg Oral Daily Freada Bergeron, MD   10 mg at 10/02/20 0927   [START ON 10/05/2020] dexmedetomidine (PRECEDEX) 400 MCG/100ML (4 mcg/mL) infusion  0.1-0.7 mcg/kg/hr Intravenous To OR Atkins, Glenice Bow, MD       [START ON 10/05/2020] EPINEPHrine (ADRENALIN) 5 mg in NS 250 mL (0.02 mg/mL) premix infusion  0-10 mcg/min Intravenous To OR Atkins, Glenice Bow, MD       [START ON 10/05/2020] heparin 30,000 units/NS 1000 mL solution for CELLSAVER   Other To OR Atkins, Glenice Bow, MD       [START ON 10/05/2020] heparin sodium (porcine) 5,000 Units, papaverine 60 mg in electrolyte-A (PLASMALYTE-A PH 7.4) 1,000 mL irrigation   Irrigation To OR Atkins, Glenice Bow, MD       hydrOXYzine (ATARAX/VISTARIL) tablet 25 mg  25 mg Oral TID PRN Damita Dunnings B, MD       insulin aspart (novoLOG) injection 0-5 Units  0-5 Units Subcutaneous QHS Oswald Hillock, MD   2 Units at 09/30/20 2148   insulin aspart (novoLOG) injection 0-9 Units  0-9 Units Subcutaneous TID WC Oswald Hillock, MD   2 Units at 10/02/20 3419   insulin glargine (LANTUS) injection 20 Units  20 Units Subcutaneous QHS Oswald Hillock, MD   20 Units at 10/01/20 2151   [START ON 10/05/2020] insulin regular, human (MYXREDLIN) 100 units/ 100 mL infusion   Intravenous To OR Atkins, Glenice Bow, MD       insulin starter kit- pen needles (English) 1 kit  1 kit Other Once Burnell Blanks, MD       isosorbide mononitrate (IMDUR) 24 hr tablet 30 mg  30 mg Oral Daily Burnell Blanks, MD   30 mg at 10/02/20 0931   living well with diabetes book MISC   Does not apply Once Burnell Blanks, MD       loperamide (IMODIUM) capsule 2 mg  2 mg Oral PRN Burnell Blanks, MD   2 mg at 09/29/20 2111   [START ON 10/05/2020] magnesium sulfate (IV Push/IM) injection 40 mEq  40 mEq Other To OR Atkins, Glenice Bow, MD       metoprolol succinate (TOPROL-XL) 24 hr tablet 12.5 mg  12.5 mg Oral Daily Gwyndolyn Kaufman E, MD   12.5 mg at 10/02/20 0931   [START ON 10/05/2020] milrinone (PRIMACOR) 20 MG/100 ML (0.2 mg/mL) infusion  0.3 mcg/kg/min Intravenous To OR Atkins, Glenice Bow, MD       morphine 2 MG/ML injection 2 mg  2 mg Intravenous Q1H PRN Burnell Blanks, MD   2 mg at 09/30/20 1923   nitroGLYCERIN (NITROSTAT) SL tablet 0.4 mg  0.4 mg Sublingual Q5 Min x 3 PRN Cecilie Kicks R, NP   0.4 mg at 09/28/20 1816   [START ON 10/05/2020] nitroGLYCERIN 50 mg in dextrose 5 % 250 mL (0.2 mg/mL) infusion  2-200 mcg/min Intravenous To OR Atkins, Glenice Bow, MD       [START ON 10/05/2020] norepinephrine (LEVOPHED) 21m in  292m premix infusion  0-40 mcg/min Intravenous To OR Atkins, BGlenice Bow MD       ondansetron (ZOFRAN) injection 4 mg  4 mg Intravenous Q6H PRN IIsaiah Serge NP       oxyCODONE (Oxy IR/ROXICODONE) immediate release tablet 5-10 mg  5-10 mg Oral Q4H PRN MBurnell Blanks MD   10 mg at 10/01/20 1948   [START ON 10/05/2020] phenylephrine (NEOSYNEPHRINE) 20-0.9 MG/250ML-% infusion  30-200 mcg/min Intravenous To OR Atkins, BGlenice Bow MD       [Derrill MemoON 10/05/2020] potassium chloride injection 80 mEq  80 mEq Other To OR  Atkins, BGlenice Bow MD       sodium chloride flush (NS) 0.9 % injection 3 mL  3 mL Intravenous Q12H MBurnell Blanks MD   3 mL at 10/02/20 04801  sodium chloride flush (NS) 0.9 % injection 3 mL  3 mL Intravenous PRN MBurnell Blanks MD       spironolactone (ALDACTONE) tablet 12.5 mg  12.5 mg Oral Daily PFreada Bergeron MD   12.5 mg at 10/02/20 0931   [START ON 10/05/2020] tranexamic acid (CYKLOKAPRON) 2,500 mg in sodium chloride 0.9 % 250 mL (10 mg/mL) infusion  1.5 mg/kg/hr Intravenous To OR Atkins, BGlenice Bow MD       [Derrill MemoON 10/05/2020] tranexamic acid (CYKLOKAPRON) bolus via infusion - over 30 minutes 1,335 mg  15 mg/kg Intravenous To OR Atkins, BGlenice Bow MD       [START ON 10/05/2020] tranexamic acid (CYKLOKAPRON) pump prime solution 178 mg  2 mg/kg Intracatheter To OR Atkins, BGlenice Bow MD       [START ON 10/05/2020] vancomycin (VANCOREADY) IVPB 1500 mg/300 mL  1,500 mg Intravenous To OR Atkins, BGlenice Bow MD        Musculoskeletal: Strength & Muscle Tone: within normal limits Gait & Station:  remains in bed on exam Patient leans: N/A            Psychiatric Specialty Exam:  Presentation  General Appearance: Appropriate for Environment  Eye Contact:Good  Speech:Clear and Coherent  Speech Volume:Normal  Handedness: No data recorded  Mood and Affect  Mood:Euthymic  Affect:Congruent   Thought Process  Thought Processes:Coherent  Descriptions of Associations:Intact  Orientation:Full (Time, Place and Person)  Thought Content:Logical  History of Schizophrenia/Schizoaffective disorder:No data recorded Duration of Psychotic Symptoms:No data recorded Hallucinations:Hallucinations: None  Ideas of Reference:None  Suicidal Thoughts:Suicidal Thoughts: No  Homicidal Thoughts:Homicidal Thoughts: No   Sensorium  Memory:Immediate Fair; Recent Fair; Remote Good  Judgment:Good  Insight:Good   Executive Functions   Concentration:Good  Attention Span:Good  [place] Knowledge:Good  Language:Good   Psychomotor Activity  Psychomotor Activity:Psychomotor Activity: Normal   Assets  Assets:Communication Skills; Desire for Improvement; Housing; Resilience   Sleep  Sleep:Sleep: Good   Physical Exam: Physical Exam Constitutional:      Appearance: Normal appearance.  HENT:     Head: Normocephalic and atraumatic.  Eyes:     Extraocular Movements: Extraocular movements intact.     Conjunctiva/sclera: Conjunctivae normal.  Cardiovascular:     Rate and Rhythm: Normal rate.  Pulmonary:     Effort: Pulmonary effort is normal.     Breath sounds: Normal breath sounds.  Abdominal:     General: Abdomen is flat.  Musculoskeletal:        General: Normal range of motion.  Skin:    General: Skin is warm and dry.  Neurological:     General: No focal deficit present.  Mental Status: She is alert.   Review of Systems  Constitutional:  Negative for chills and fever.  HENT:  Negative for hearing loss.   Eyes:  Negative for blurred vision.  Respiratory:  Negative for cough and wheezing.   Cardiovascular:  Negative for chest pain.  Gastrointestinal:  Negative for abdominal pain.  Neurological:  Negative for dizziness.  Psychiatric/Behavioral:  Negative for depression, hallucinations and suicidal ideas.   Blood pressure 116/65, pulse 91, temperature 98.7 F (37.1 C), temperature source Oral, resp. rate 18, height '5\' 8"'  (1.727 m), weight 89 kg, SpO2 99 %. Body mass index is 29.82 kg/m.  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 Patient is now at the point of being able to care for her health without her anxiety and phobia interfering with her chronic Illness.  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 Patient will need to continue to write down notes of her time with providers and watch videos about how to care for herself.  Patient does very well with education and does follow through on reading materials provided to her. - Continue to educate patient on her health - Recommend providing handouts about procedures or diagnoses for patient  Disposition: Supportive therapy provided about ongoing stressors.   Patient is determined to be psychiatrically stable at this time. Psychiatry will sign off. Please do not hesitate to call back if questions arise. Thank you for this consult.    PGY-1 JFreida Busman MD 10/02/2020 10:02 AM

## 2020-10-02 NOTE — Progress Notes (Signed)
OT Cancellation Note  Patient Details Name: Sabrina Ellis MRN: [id number] DOB: February 11, 1956   Cancelled Treatment:    Reason Eval/Treat Not Completed: Patient declined, no reason specified;Other (comment) Pt eating breakfast currently. Will follow-up again for OT eval as schedule permits  Lorre Munroe 10/02/2020, 8:13 AM

## 2020-10-02 NOTE — Progress Notes (Signed)
Discussed with pt IS (1750 mL), sternal precautions, and mobility post op. Pt was receptive initially and remembered the sternal precautions from when PT went over them earlier. Pt became tangential and upset when I mentioned her DNR bracelet does not line up with her code status in the chart. Pt expressed that she is being lied to as no one has helped her do a living will. She expresses distrust in "you all" due to this issue and "how can I trust you to do surgery?". Encouraged pt to read her OHS booklet. Notified RN that she is currently upset. No angina noted. [id number] Ethelda Chick CES, ACSM 2:27 PM 10/02/2020

## 2020-10-02 NOTE — Progress Notes (Signed)
Patient gave herself am insulin injection with RN guidance. Patient had forgotten steps to administer injection from previous teachings earlier in the shift but was able to give herself the injection with RN providing verbal cues.

## 2020-10-06 SURGERY — CORONARY ARTERY BYPASS GRAFTING (CABG)
Anesthesia: General | Site: Chest

## 2020-10-18 NOTE — Discharge Summary (Signed)
Discharge Summary    Patient ID: Sabrina Ellis,  MRN: [id number], DOB/AGE: June 27, 1955 65 y.o.  Admit date: 09/25/2020 Discharge date: 10/18/2020  Primary Care Provider: Pcp, No Primary Cardiologist: Verne Carrow, MD   Discharge Diagnoses    Active Problems:   STEMI (ST elevation myocardial infarction) (HCC)   Acute ST elevation myocardial infarction (STEMI) of inferior wall (HCC)   Cerebral thrombosis with cerebral infarction   Cerebral embolism with cerebral infarction   Diabetes mellitus due to underlying condition with hyperosmolarity without coma, without long-term current use of insulin (HCC)   Goals of care, counseling/discussion   Allergies Allergies  Allergen Reactions   Latex Swelling   Cashew Nut Oil Itching and Rash   Tomato Itching and Rash     History of Present Illness   Sabrina Ellis has had 2-3 months of episodic chest pain and told someone today because it seemed worse.  The friend called EMS and pt was being taken to [facility] hosp when ST became elevated in II, III, AVF.  [facility] cardiac cath.  Remote tobacco hx stopped 5 years ago.       She does not follow with MD. No meds.  By EMS rec'd 324 mg of ASA and IV morphine for chest pain.  Currently here 4/10 pain.        Hospital Course     Consultants: Internal medicine, psychiatry, neurology, cardiac surgery, social work  Echo 09/26/2020  1. Poor quality images even with definity Markedly abnormal septal motion  inferior wall hypokinesis . Left ventricular ejection fraction, by  estimation, is 40 to 45%. The left ventricle has mildly decreased  function. The left ventricle demonstrates  regional wall motion abnormalities (see scoring diagram/findings for  description). Left ventricular diastolic parameters were normal.   2. Right ventricular systolic function is normal. The right ventricular  size is normal.   3. Right atrial size was mildly  dilated.   4. The mitral valve is abnormal. Trivial mitral valve regurgitation. No  evidence of mitral stenosis.   5. The aortic valve is tricuspid. There is mild calcification of the  aortic valve. Aortic valve regurgitation is not visualized. Mild aortic  valve sclerosis is present, with no evidence of aortic valve stenosis.   6. The inferior vena cava is normal in size with greater than 50%  respiratory variability, suggesting right atrial pressure of 3 mmHg.   Cath 09/25/20 CORONARY STENT INTERVENTION  LEFT HEART CATH AND CORONARY ANGIOGRAPHY      Conclusion     Mid Cx lesion is 80% stenosed. Prox LAD lesion is 70% stenosed. Ost RCA to Dist RCA lesion is 100% stenosed.   1. Late presenting inferior ST elevation MI secondary to occlusion of the large, dominant RCA in the proximal segment. Unsuccessful attempt at PCI. There are left to right collaterals filling the distal RCA branches. 2. Moderately severe proximal LAD stenosis 3. Severe mid Circumflex stenosis   Recommendations: She is pain free. Unable to perform PCI of the RCA as I could not pass a wire beyond the totally occluded proximal vessel. She is felt to have a late presenting inferior MI. Given three vessel CAD, will consider CABG as an option for revascularization. I will admit her to the ICU. Continue ASA/Brilinta for now. Will start a high intensity statin. Start a beta blocker. Echo in the am. CT surgery consultation this weekend to consider CABG. We will call for consultation tomorrow and if  she is felt to be a good candidate for bypass, would stop the Brilinta and start IV heparin. All labs pending at this time.   Diagnostic Dominance: Right        _____________   #Late Presentation of Inferior STEMI: #Multivessel CAD: Patient presented with 2-45month history of worsening chest pain that acutely worsened about 1 week prior to admission. Underwent coronary angiography as detailed above found to have multivessel  CAD not amenable to PCI. Has been recommended for CABG, but has been difficult to navigate due to patient's initial code status of DNR which has subsequently been clarified to limited resuscitation. Has been seen by CV surgery and was planned for CABG but left AMA prior to her OR date.   #Ischemic CM: LVEF 40-45% with septal and inferior wall hypokinesis in the setting of multivessel CAD and inferior STEMI as above. Clinically euvolemic and compensated. Left AMA prior to CABG as detailed above.    #Possible pericarditis: Pleuritic chest pain during hospitalization. Improved with colchicine.   #Uncontrolled DMII: A1C 14. Difficult situation to manage, as patient initially refusing BG checks. Became more amenable to BG checks and insulin administration but then left AMA.   #Acute cerebellar infarcts on MRI: Suspicious for cardioembolic source. Would not explain memory loss per Neuro.   #Memory Difficulties: #Depression: Was ongoing issue throughout hospitalization. MRI brain with evidence of acute ischemia in the cerebellum, but not likely to be the source of memory loss. Neurology following as detailed above. Psych and social work also followed. Left AMA.    Discharge Vitals Blood pressure 108/73, pulse (!) 115, temperature 98.7 F (37.1 C), temperature source Oral, resp. rate 18, height 5\' 8"  (1.727 m), weight 89 kg, SpO2 97 %.  Filed Weights   09/30/20 0315 10/01/20 0500 10/02/20 0456  Weight: 88.8 kg 88.9 kg 89 kg    Labs & Radiologic Studies     CBC No results for input(s): WBC, NEUTROABS, HGB, HCT, MCV, PLT in the last 72 hours. Basic Metabolic Panel No results for input(s): NA, K, CL, CO2, GLUCOSE, BUN, CREATININE, CALCIUM, MG, PHOS in the last 72 hours. Liver Function Tests No results for input(s): AST, ALT, ALKPHOS, BILITOT, PROT, ALBUMIN in the last 72 hours. No results for input(s): LIPASE, AMYLASE in the last 72 hours. Cardiac Enzymes No results for input(s): CKTOTAL,  CKMB, CKMBINDEX, TROPONINI in the last 72 hours. BNP Invalid input(s): POCBNP D-Dimer No results for input(s): DDIMER in the last 72 hours. Hemoglobin A1C No results for input(s): HGBA1C in the last 72 hours. Fasting Lipid Panel No results for input(s): CHOL, HDL, LDLCALC, TRIG, CHOLHDL, LDLDIRECT in the last 72 hours. Thyroid Function Tests No results for input(s): TSH, T4TOTAL, T3FREE, THYROIDAB in the last 72 hours.  Invalid input(s): FREET3  CT ANGIO HEAD NECK W WO CM  Result Date: 10/01/2020 CLINICAL DATA:  Stroke EXAM: CT ANGIOGRAPHY HEAD AND NECK TECHNIQUE: Multidetector CT imaging of the head and neck was performed using the standard protocol during bolus administration of intravenous contrast. Multiplanar CT image reconstructions and MIPs were obtained to evaluate the vascular anatomy. Carotid stenosis measurements (when applicable) are obtained utilizing NASCET criteria, using the distal internal carotid diameter as the denominator. CONTRAST:  30mL OMNIPAQUE IOHEXOL 350 MG/ML SOLN COMPARISON:  MRI head 09/29/2020 FINDINGS: CT HEAD FINDINGS Brain: Small areas of acute infarct in the right parietal lobe and right cerebellum and left cerebellum are best identified on diffusion-weighted imaging. Negative for acute hemorrhage or mass. Mild white matter changes  consistent with chronic microvascular ischemia. Vascular: Negative for hyperdense vessel Skull: Negative Sinuses: Negative Orbits: Negative Review of the MIP images confirms the above findings CTA NECK FINDINGS Aortic arch: Mild atherosclerotic calcification aortic arch. No aneurysm. Proximal great vessels widely patent Right carotid system: Mild atherosclerotic disease right carotid bifurcation without significant stenosis Left carotid system: Mild atherosclerotic disease left carotid bifurcation without stenosis. Vertebral arteries: Both vertebral arteries patent to the basilar without stenosis. Skeleton: No acute abnormality. Other  neck: Multinodular goiter. Largest nodules include 13 mm right lower pole nodule and 13 mm cystic nodule left midpole. No further imaging necessary based on nodule size. Upper chest: Mild apical emphysema.  No acute abnormality. Review of the MIP images confirms the above findings CTA HEAD FINDINGS Anterior circulation: Internal carotid artery patent bilaterally. Anterior and middle cerebral arteries widely patent without stenosis or large vessel occlusion. Posterior circulation: Both vertebral arteries patent to the basilar. Distal left vertebral artery is hypoplastic. PICA patent bilaterally. Basilar widely patent. AICA patent. Basilar ends in the superior cerebellar arteries bilaterally. Fetal origin of the posterior cerebral artery bilaterally without stenosis. Venous sinuses: Normal venous enhancement. Anatomic variants: None Review of the MIP images confirms the above findings IMPRESSION: 1. Acute infarcts in the cerebellum bilaterally and right parietal lobe best seen on diffusion-weighted imaging. Negative for hemorrhage. 2. No intracranial large vessel occlusion 3. No significant carotid or vertebral artery stenosis in the neck. Electronically Signed   By: Marlan Palau M.D.   On: 10/01/2020 17:11   MR BRAIN WO CONTRAST  Result Date: 09/29/2020 CLINICAL DATA:  Memory loss EXAM: MRI HEAD WITHOUT CONTRAST TECHNIQUE: Multiplanar, multiecho pulse sequences of the brain and surrounding structures were obtained without intravenous contrast. COMPARISON:  None. FINDINGS: Brain: Small foci of abnormal diffusion restriction within both cerebellar hemispheres and in the right parietal lobe. Single focus of chronic microhemorrhage in the right temporal lobe. There is multifocal hyperintense T2-weighted signal within the white matter. Parenchymal volume and CSF spaces are normal. The midline structures are normal. Vascular: Major flow voids are preserved. Skull and upper cervical spine: Normal calvarium and skull  base. Visualized upper cervical spine and soft tissues are normal. Sinuses/Orbits:No paranasal sinus fluid levels or advanced mucosal thickening. No mastoid or middle ear effusion. Normal orbits. IMPRESSION: 1. Small foci of acute ischemia within both cerebellar hemispheres and right parietal lobe. No hemorrhage or mass effect. 2. Findings of chronic microvascular ischemia. Electronically Signed   By: Deatra Robinson M.D.   On: 09/29/2020 23:01   CARDIAC CATHETERIZATION  Result Date: 09/25/2020  Mid Cx lesion is 80% stenosed.  Prox LAD lesion is 70% stenosed.  Ost RCA to Dist RCA lesion is 100% stenosed.  1. Late presenting inferior ST elevation MI secondary to occlusion of the large, dominant RCA in the proximal segment. Unsuccessful attempt at PCI. There are left to right collaterals filling the distal RCA branches. 2. Moderately severe proximal LAD stenosis 3. Severe mid Circumflex stenosis Recommendations: She is pain free. Unable to perform PCI of the RCA as I could not pass a wire beyond the totally occluded proximal vessel. She is felt to have a late presenting inferior MI. Given three vessel CAD, will consider CABG as an option for revascularization. I will admit her to the ICU. Continue ASA/Brilinta for now. Will start a high intensity statin. Start a beta blocker. Echo in the am. CT surgery consultation this weekend to consider CABG. We will call for consultation tomorrow and if she is felt  to be a good candidate for bypass, would stop the Brilinta and start IV heparin. All labs pending at this time.   Portable chest x-ray 1 view  Result Date: 09/25/2020 CLINICAL DATA:  Chest pain EXAM: PORTABLE CHEST 1 VIEW COMPARISON:  None. FINDINGS: Heart and mediastinal contours are within normal limits. No focal opacities or effusions. No acute bony abnormality. IMPRESSION: No active disease. Electronically Signed   By: Charlett Nose M.D.   On: 09/25/2020 20:09   ECHOCARDIOGRAM COMPLETE  Result Date:  09/26/2020    ECHOCARDIOGRAM REPORT   Patient Name:   Sabrina Ellis Date of Exam: 09/26/2020 Medical Rec #:  [id number]            Height:       68.0 in Accession #:    [id number]           Weight:       196.2 lb Date of Birth:  11-24-1955            BSA:          2.027 m Patient Age:    64 years             BP:           127/73 mmHg Patient Gender: F                    HR:           76 bpm. Exam Location:  Inpatient Procedure: 2D Echo, Cardiac Doppler, Color Doppler and Intracardiac            Opacification Agent Indications:    R07.9* Chest pain, unspecified  History:        Patient has no prior history of Echocardiogram examinations.                 Acute MI and CAD.  Sonographer:    Roosvelt Maser RDCS Referring Phys: [id number] LAURA R INGOLD IMPRESSIONS  1. Poor quality images even with definity Markedly abnormal septal motion inferior wall hypokinesis . Left ventricular ejection fraction, by estimation, is 40 to 45%. The left ventricle has mildly decreased function. The left ventricle demonstrates regional wall motion abnormalities (see scoring diagram/findings for description). Left ventricular diastolic parameters were normal.  2. Right ventricular systolic function is normal. The right ventricular size is normal.  3. Right atrial size was mildly dilated.  4. The mitral valve is abnormal. Trivial mitral valve regurgitation. No evidence of mitral stenosis.  5. The aortic valve is tricuspid. There is mild calcification of the aortic valve. Aortic valve regurgitation is not visualized. Mild aortic valve sclerosis is present, with no evidence of aortic valve stenosis.  6. The inferior vena cava is normal in size with greater than 50% respiratory variability, suggesting right atrial pressure of 3 mmHg. FINDINGS  Left Ventricle: Poor quality images even with definity Markedly abnormal septal motion inferior wall hypokinesis. Left ventricular ejection fraction, by estimation, is 40 to 45%. The left ventricle has mildly  decreased function. The left ventricle demonstrates regional wall motion abnormalities. The left ventricular internal cavity size was normal in size. There is no left ventricular hypertrophy. Left ventricular diastolic parameters were normal. Right Ventricle: The right ventricular size is normal. No increase in right ventricular wall thickness. Right ventricular systolic function is normal. Left Atrium: Left atrial size was normal in size. Right Atrium: Right atrial size was mildly dilated. Pericardium: There is no evidence of pericardial effusion. Mitral Valve: The mitral  valve is abnormal. There is mild thickening of the mitral valve leaflet(s). Trivial mitral valve regurgitation. No evidence of mitral valve stenosis. Tricuspid Valve: The tricuspid valve is normal in structure. Tricuspid valve regurgitation is mild . No evidence of tricuspid stenosis. Aortic Valve: The aortic valve is tricuspid. There is mild calcification of the aortic valve. Aortic valve regurgitation is not visualized. Mild aortic valve sclerosis is present, with no evidence of aortic valve stenosis. Aortic valve mean gradient measures 3.0 mmHg. Aortic valve peak gradient measures 5.5 mmHg. Aortic valve area, by VTI measures 2.96 cm. Pulmonic Valve: The pulmonic valve was normal in structure. Pulmonic valve regurgitation is not visualized. No evidence of pulmonic stenosis. Aorta: The aortic root is normal in size and structure. Venous: The inferior vena cava is normal in size with greater than 50% respiratory variability, suggesting right atrial pressure of 3 mmHg. IAS/Shunts: No atrial level shunt detected by color flow Doppler.  LEFT VENTRICLE PLAX 2D LVIDd:         4.40 cm  Diastology LVIDs:         3.10 cm  LV e' medial:    6.64 cm/s LV PW:         1.00 cm  LV E/e' medial:  8.8 LV IVS:        1.10 cm  LV e' lateral:   11.50 cm/s LVOT diam:     2.00 cm  LV E/e' lateral: 5.1 LV SV:         51 LV SV Index:   25 LVOT Area:     3.14 cm  RIGHT  VENTRICLE RV Basal diam:  4.70 cm RV Mid diam:    4.20 cm RV S prime:     7.29 cm/s TAPSE (M-mode): 0.9 cm LEFT ATRIUM           Index       RIGHT ATRIUM           Index LA diam:      2.60 cm 1.28 cm/m  RA Area:     20.60 cm LA Vol (A2C): 45.1 ml 22.25 ml/m RA Volume:   74.70 ml  36.85 ml/m LA Vol (A4C): 40.7 ml 20.08 ml/m  AORTIC VALVE AV Area (Vmax):    2.85 cm AV Area (Vmean):   3.27 cm AV Area (VTI):     2.96 cm AV Vmax:           117.00 cm/s AV Vmean:          77.000 cm/s AV VTI:            0.173 m AV Peak Grad:      5.5 mmHg AV Mean Grad:      3.0 mmHg LVOT Vmax:         106.00 cm/s LVOT Vmean:        80.100 cm/s LVOT VTI:          0.163 m LVOT/AV VTI ratio: 0.94  AORTA Ao Root diam: 3.20 cm Ao Asc diam:  3.40 cm MITRAL VALVE MV Area (PHT): 5.23 cm    SHUNTS MV Decel Time: 145 msec    Systemic VTI:  0.16 m MV E velocity: 58.20 cm/s  Systemic Diam: 2.00 cm MV A velocity: 86.80 cm/s MV E/A ratio:  0.67 Charlton HawsPeter Nishan MD Electronically signed by Charlton HawsPeter Nishan MD Signature Date/Time: 09/26/2020/11:48:16 AM    Final      Diagnostic Studies/Procedures    Echo 09/26/2020  1. Poor quality images even  with definity Markedly abnormal septal motion  inferior wall hypokinesis . Left ventricular ejection fraction, by  estimation, is 40 to 45%. The left ventricle has mildly decreased  function. The left ventricle demonstrates  regional wall motion abnormalities (see scoring diagram/findings for  description). Left ventricular diastolic parameters were normal.   2. Right ventricular systolic function is normal. The right ventricular  size is normal.   3. Right atrial size was mildly dilated.   4. The mitral valve is abnormal. Trivial mitral valve regurgitation. No  evidence of mitral stenosis.   5. The aortic valve is tricuspid. There is mild calcification of the  aortic valve. Aortic valve regurgitation is not visualized. Mild aortic  valve sclerosis is present, with no evidence of aortic valve  stenosis.   6. The inferior vena cava is normal in size with greater than 50%  respiratory variability, suggesting right atrial pressure of 3 mmHg.   Cath 09/25/20 CORONARY STENT INTERVENTION  LEFT HEART CATH AND CORONARY ANGIOGRAPHY      Conclusion     Mid Cx lesion is 80% stenosed. Prox LAD lesion is 70% stenosed. Ost RCA to Dist RCA lesion is 100% stenosed.   1. Late presenting inferior ST elevation MI secondary to occlusion of the large, dominant RCA in the proximal segment. Unsuccessful attempt at PCI. There are left to right collaterals filling the distal RCA branches. 2. Moderately severe proximal LAD stenosis 3. Severe mid Circumflex stenosis   Recommendations: She is pain free. Unable to perform PCI of the RCA as I could not pass a wire beyond the totally occluded proximal vessel. She is felt to have a late presenting inferior MI. Given three vessel CAD, will consider CABG as an option for revascularization. I will admit her to the ICU. Continue ASA/Brilinta for now. Will start a high intensity statin. Start a beta blocker. Echo in the am. CT surgery consultation this weekend to consider CABG. We will call for consultation tomorrow and if she is felt to be a good candidate for bypass, would stop the Brilinta and start IV heparin. All labs pending at this time.   Diagnostic Dominance: Right       _____________    Disposition   Pt is being discharged home today in good condition.  Follow-up Plans & Appointments     Follow-up Information     [facility]. Schedule an appointment as soon as possible for a visit in 1 month(s).   Specialty: Neurology Why: stroke clinic Contact information: [address] [place] [address] [phone]               Discharge Instructions     Amb Referral to Nutrition and Diabetic Education   Complete by: As directed    Ambulatory referral to Neurology   Complete by: As  directed    Follow up with stroke clinic NP (Jessica Vanschaick or Darrol Angel, if both not available, consider Manson Allan, or Ahern) at [facility] in about 4 weeks. Thanks.       Discharge Medications     Medication List    You have not been prescribed any medications.     Patient left AMA.   Outstanding Labs/Studies   Patient left AMA  Duration of Discharge Encounter   Greater than 30 minutes including physician time.  Signed, Meriam Sprague MD 10/18/2020, 7:11 PM

## 2021-03-20 ENCOUNTER — Encounter (HOSPITAL_COMMUNITY): Payer: Self-pay | Admitting: Emergency Medicine

## 2021-03-20 ENCOUNTER — Other Ambulatory Visit (HOSPITAL_COMMUNITY): Payer: Self-pay | Admitting: *Deleted

## 2021-03-20 ENCOUNTER — Emergency Department (HOSPITAL_BASED_OUTPATIENT_CLINIC_OR_DEPARTMENT_OTHER): Payer: Medicare Other

## 2021-03-20 ENCOUNTER — Emergency Department (HOSPITAL_COMMUNITY): Payer: Medicare Other

## 2021-03-20 ENCOUNTER — Other Ambulatory Visit: Payer: Self-pay

## 2021-03-20 ENCOUNTER — Observation Stay (HOSPITAL_COMMUNITY)
Admission: EM | Admit: 2021-03-20 | Discharge: 2021-03-20 | Disposition: A | Discharge status: Home / Self Care | Payer: Medicare Other | Attending: Internal Medicine | Admitting: Internal Medicine

## 2021-03-20 DIAGNOSIS — I5020 Unspecified systolic (congestive) heart failure: Secondary | ICD-10-CM

## 2021-03-20 DIAGNOSIS — I252 Old myocardial infarction: Secondary | ICD-10-CM

## 2021-03-20 DIAGNOSIS — Z20822 Contact with and (suspected) exposure to covid-19: Secondary | ICD-10-CM | POA: Insufficient documentation

## 2021-03-20 DIAGNOSIS — Z87891 Personal history of nicotine dependence: Secondary | ICD-10-CM | POA: Insufficient documentation

## 2021-03-20 DIAGNOSIS — R072 Precordial pain: Secondary | ICD-10-CM | POA: Diagnosis present

## 2021-03-20 DIAGNOSIS — R079 Chest pain, unspecified: Secondary | ICD-10-CM

## 2021-03-20 DIAGNOSIS — R739 Hyperglycemia, unspecified: Secondary | ICD-10-CM

## 2021-03-20 DIAGNOSIS — Z8673 Personal history of transient ischemic attack (TIA), and cerebral infarction without residual deficits: Secondary | ICD-10-CM | POA: Diagnosis not present

## 2021-03-20 DIAGNOSIS — E119 Type 2 diabetes mellitus without complications: Secondary | ICD-10-CM | POA: Diagnosis not present

## 2021-03-20 DIAGNOSIS — I200 Unstable angina: Secondary | ICD-10-CM

## 2021-03-20 DIAGNOSIS — E876 Hypokalemia: Secondary | ICD-10-CM

## 2021-03-20 HISTORY — DX: Cerebral infarction, unspecified: I63.9

## 2021-03-20 LAB — CBC
HCT: 35.4 % — ABNORMAL LOW (ref 36.0–46.0)
HCT: 38.8 % (ref 36.0–46.0)
Hemoglobin: 11.6 g/dL — ABNORMAL LOW (ref 12.0–15.0)
Hemoglobin: 12.4 g/dL (ref 12.0–15.0)
MCH: 29.8 pg (ref 26.0–34.0)
MCH: 29.3 pg (ref 26.0–34.0)
MCHC: 32.8 g/dL (ref 30.0–36.0)
MCHC: 32 g/dL (ref 30.0–36.0)
MCV: 91 fL (ref 80.0–100.0)
MCV: 91.7 fL (ref 80.0–100.0)
Platelets: 248 10*3/uL (ref 150–400)
Platelets: 264 10*3/uL (ref 150–400)
RBC: 3.89 MIL/uL (ref 3.87–5.11)
RBC: 4.23 MIL/uL (ref 3.87–5.11)
RDW: 13.2 % (ref 11.5–15.5)
RDW: 13.3 % (ref 11.5–15.5)
WBC: 5.2 10*3/uL (ref 4.0–10.5)
WBC: 5.7 10*3/uL (ref 4.0–10.5)
nRBC: 0 % (ref 0.0–0.2)
nRBC: 0 % (ref 0.0–0.2)

## 2021-03-20 LAB — BASIC METABOLIC PANEL
Anion gap: 7 (ref 5–15)
BUN: 15 mg/dL (ref 8–23)
CO2: 24 mmol/L (ref 22–32)
Calcium: 8.9 mg/dL (ref 8.9–10.3)
Chloride: 103 mmol/L (ref 98–111)
Creatinine, Ser: 0.69 mg/dL (ref 0.44–1.00)
GFR, Estimated: >60 mL/min (ref >60)
Glucose, Bld: 223 mg/dL — ABNORMAL HIGH (ref 70–99)
Potassium: 3.4 mmol/L — ABNORMAL LOW (ref 3.5–5.1)
Sodium: 134 mmol/L — ABNORMAL LOW (ref 135–145)

## 2021-03-20 LAB — HEMOGLOBIN A1C
Hgb A1c MFr Bld: 9.7 % — ABNORMAL HIGH (ref 4.8–5.6)
Mean Plasma Glucose: 231.69 mg/dL

## 2021-03-20 LAB — TROPONIN I (HIGH SENSITIVITY)
Troponin I (High Sensitivity): 11 ng/L (ref <18)
Troponin I (High Sensitivity): 11 ng/L (ref <18)

## 2021-03-20 LAB — MAGNESIUM: Magnesium: 2 mg/dL (ref 1.7–2.4)

## 2021-03-20 LAB — RESP PANEL BY RT-PCR (FLU A&B, COVID) ARPGX2
Influenza A by PCR: NEGATIVE
Influenza B by PCR: NEGATIVE
SARS Coronavirus 2 by RT PCR: NEGATIVE

## 2021-03-20 LAB — ECHOCARDIOGRAM COMPLETE
Area-P 1/2: 2.56 cm2
Height: 68 in
S' Lateral: 4.2 cm
Weight: 3520 oz

## 2021-03-20 LAB — LIPID PANEL
Cholesterol: 172 mg/dL (ref 0–200)
HDL: 35 mg/dL — ABNORMAL LOW (ref >40)
LDL Cholesterol: 116 mg/dL — ABNORMAL HIGH (ref 0–99)
Total CHOL/HDL Ratio: 4.9 RATIO
Triglycerides: 107 mg/dL (ref <150)
VLDL: 21 mg/dL (ref 0–40)

## 2021-03-20 LAB — PHOSPHORUS: Phosphorus: 2.9 mg/dL (ref 2.5–4.6)

## 2021-03-20 LAB — CREATININE, SERUM
Creatinine, Ser: 0.67 mg/dL (ref 0.44–1.00)
GFR, Estimated: >60 mL/min (ref >60)

## 2021-03-20 MED ORDER — ENOXAPARIN SODIUM 40 MG/0.4ML IJ SOSY
40.0000 mg | PREFILLED_SYRINGE | INTRAMUSCULAR | Status: DC
  Filled 2021-03-20: qty 0.4

## 2021-03-20 MED ORDER — POTASSIUM CHLORIDE CRYS ER 20 MEQ PO TBCR
40.0000 meq | EXTENDED_RELEASE_TABLET | Freq: Once | ORAL | Status: AC
  Administered 2021-03-20: 40 meq via ORAL
  Filled 2021-03-20: qty 2

## 2021-03-20 MED ORDER — ASPIRIN 81 MG PO CHEW
324.0000 mg | CHEWABLE_TABLET | Freq: Once | ORAL | Status: AC
  Administered 2021-03-20: 324 mg via ORAL
  Filled 2021-03-20: qty 4

## 2021-03-20 MED ORDER — ASPIRIN EC 81 MG PO TBEC
81.0000 mg | DELAYED_RELEASE_TABLET | Freq: Every day | ORAL | Status: DC
  Administered 2021-03-20: 81 mg via ORAL
  Filled 2021-03-20: qty 1

## 2021-03-20 MED ORDER — ATORVASTATIN CALCIUM 40 MG PO TABS
40.0000 mg | ORAL_TABLET | Freq: Every day | ORAL | Status: DC
  Administered 2021-03-20: 40 mg via ORAL
  Filled 2021-03-20: qty 1

## 2021-03-20 MED ORDER — IOHEXOL 350 MG/ML SOLN
100.0000 mL | Freq: Once | INTRAVENOUS | Status: AC | PRN
  Administered 2021-03-20: 100 mL via INTRAVENOUS

## 2021-03-20 MED ORDER — NITROGLYCERIN 0.4 MG SL SUBL: 0.4000 mg | SUBLINGUAL_TABLET | SUBLINGUAL | Status: DC | PRN

## 2021-03-20 MED ORDER — METOPROLOL TARTRATE 25 MG PO TABS
12.5000 mg | ORAL_TABLET | Freq: Two times a day (BID) | ORAL | 3 refills | Status: AC
Start: 2021-03-20 — End: 2021-04-19

## 2021-03-20 MED ORDER — METOPROLOL TARTRATE 25 MG PO TABS
12.5000 mg | ORAL_TABLET | Freq: Two times a day (BID) | ORAL | Status: DC
  Filled 2021-03-20: qty 1

## 2021-03-20 MED ORDER — NITROGLYCERIN 0.4 MG SL SUBL: 0.4000 mg | SUBLINGUAL_TABLET | SUBLINGUAL | 12 refills | Status: AC | PRN

## 2021-03-20 MED ORDER — ATORVASTATIN CALCIUM 40 MG PO TABS
40.0000 mg | ORAL_TABLET | Freq: Every day | ORAL | 3 refills | Status: AC
Start: 2021-03-21 — End: 2021-04-20

## 2021-03-20 MED ORDER — ASPIRIN 81 MG PO TBEC: 81.0000 mg | DELAYED_RELEASE_TABLET | Freq: Every day | ORAL | 11 refills | Status: AC

## 2021-03-20 NOTE — Progress Notes (Signed)
*  PRELIMINARY RESULTS* Echocardiogram 2D Echocardiogram has been performed.  Stacey Drain 03/20/2021, 9:45 AM

## 2021-03-20 NOTE — ED Notes (Signed)
Patient transported to CT 

## 2021-03-20 NOTE — TOC Transition Note (Signed)
Transition of Care [facility]) - CM/SW Discharge Note   Patient Details  Name: Sabrina Ellis MRN: [id number] Date of Birth: 27-Oct-1955  Transition of Care [facility]) CM/SW Contact:  Barry Brunner, LCSW Phone Number: 03/20/2021, 4:12 PM   Clinical Narrative:    CSW notified of patient's readiness for discharge and patient's difficulty affording medications. CSW consulted TOC supervisor and inquired if there is any assistance that could be provided given that patient is insured. TOC supervisor reported that [facility] did not have any programs that could assist patient with mediation cost upon discharge given patients insurance status, but [facility] and or PCP could work with patient in identifying long term assistance with medication. CSW provided patient. With PCP list and discussed PCP and [facility] assistance. TOC supervisor to follow up Monday. CSW contacted [facility] for transport. [facility] agreeable to provide transport. TOC signing off.   Final next level of care: Home/Self Care Barriers to Discharge: Barriers Resolved   Patient Goals and CMS Choice Patient states their goals for this hospitalization and ongoing recovery are:: Return home CMS Medicare.gov Compare Post Acute Care list provided to:: Patient Choice offered to / list presented to : Patient  Discharge Placement                    Patient and family notified of of transfer: 03/20/21  Discharge Plan and Services                                     Social Determinants of Health (SDOH) Interventions     Readmission Risk Interventions No flowsheet data found.

## 2021-03-20 NOTE — ED Notes (Signed)
Pt resting with eyes closed. Awakens easily. Sat up to take medication. Pt requested to lay back and return to sleep. Lights dimmed, call bell in reach.

## 2021-03-20 NOTE — ED Notes (Signed)
ED Provider at bedside. 

## 2021-03-20 NOTE — Discharge Summary (Signed)
Physician Discharge Summary  Sabrina Ellis LTJ:[id number] DOB: 05/08/1955 DOA: 03/20/2021  PCP: Pcp, No  Admit date: 03/20/2021  Discharge date: 03/20/2021  Admitted From:Home  Disposition:  Home  Recommendations for Outpatient Follow-up:  Follow up with PCP in 1-2 weeks; new list given Continue on aspirin, statin, metoprolol, nitroglycerin as needed as ordered Follow-up with cardiology in [place] with referral sent  Home Health:None  Equipment/Devices:None  Discharge Condition:Stable  CODE STATUS: DNR  Diet recommendation: Heart Healthy/carb modified  Brief/Interim Summary:  Sabrina Ellis is a 65 y.o. female with medical history significant for STEMI, prior tobacco use (stopped 5 years ago) who presents to the emergency department due to right sided upper back pain and left sided sharp nonradiating, nonreproducible chest pain which was rated as 9/10 on pain scale with forceful palpitation which started while lying in bed at home yesterday evening.  She was admitted for chest pain evaluation in the setting of severe multivessel CAD which is likely reflective of unstable angina.  Her troponin levels were not increased and EKG was unchanged.  Patient does not desire any catheterization or surgical procedures and is a DNR.  She would simply like some medication management.  Discussion was had with cardiologist on-call Dr. Shari Prows who agrees that medication management would be appropriate for now and 2D echocardiogram has not shown a significant change from 6/22.  Her LVEF is currently 35-40% with global hypokinesis of the left ventricle.  She has had stable vital signs and is no longer in chest pain and agrees to take the medications as prescribed and follow-up.  No other concerns or events otherwise noted during the course of the stay.  I have encouraged her to remain on a carb modified diet and monitor her blood glucose levels, but she is not quite ready to do this and would like  to follow-up with a physician outpatient to discuss diabetes management.  Therefore, I will not start her on any hypoglycemic agents as of yet until she is ready to carefully monitor at home.  Discharge Diagnoses:  Principal Problem:   Chest pain Active Problems:   History of ST elevation myocardial infarction (STEMI)   HFrEF (heart failure with reduced ejection fraction) (HCC)   Hypokalemia   Hyperglycemia  Principal discharge diagnosis: Chest pain suggestive of unstable angina in the setting of severe CAD.  Discharge Instructions  Discharge Instructions     Diet - low sodium heart healthy   Complete by: As directed    Increase activity slowly   Complete by: As directed       Allergies as of 03/20/2021       Reactions   Latex Swelling   Cashew Nut Oil Itching, Rash   Tomato Itching, Rash        Medication List     STOP taking these medications    aspirin 81 MG chewable tablet Replaced by: aspirin 81 MG EC tablet       TAKE these medications    aspirin 81 MG EC tablet Take 1 tablet (81 mg total) by mouth daily. Swallow whole. Start taking on: March 21, 2021 Replaces: aspirin 81 MG chewable tablet   atorvastatin 40 MG tablet Commonly known as: LIPITOR Take 1 tablet (40 mg total) by mouth daily. Start taking on: March 21, 2021   metoprolol tartrate 25 MG tablet Commonly known as: LOPRESSOR Take 0.5 tablets (12.5 mg total) by mouth 2 (two) times daily.   nitroGLYCERIN 0.4 MG SL tablet Commonly known as: NITROSTAT Place  1 tablet (0.4 mg total) under the tongue every 5 (five) minutes as needed for chest pain.        Follow-up Information     pcp. Schedule an appointment as soon as possible for a visit in 1 week(s).          [facility]. Schedule an appointment as soon as possible for a visit.   Specialty: Cardiology Contact information: [address] [place] [address] [phone]                Allergies  Allergen Reactions   Latex Swelling   Cashew Nut Oil Itching and Rash   Tomato Itching and Rash    Consultations: Discussed case with cardiology Dr. Shari Prows   Procedures/Studies: CT Angio Chest PE W and/or Wo Contrast  Result Date: 03/20/2021 CLINICAL DATA:  65 year old female with midsternal chest pain since 2200 hours. Pain radiating to the back. Coronary artery disease. EXAM: CT ANGIOGRAPHY CHEST WITH CONTRAST TECHNIQUE: Multidetector CT imaging of the chest was performed using the standard protocol during bolus administration of intravenous contrast. Multiplanar CT image reconstructions and MIPs were obtained to evaluate the vascular anatomy. CONTRAST:  OMNIPAQUE IOHEXOL 350 MG/ML SOLN COMPARISON:  CTA head and neck 10/01/2020. Portable chest 0227 hours. FINDINGS: Cardiovascular: Good contrast bolus timing in the pulmonary arterial tree. No focal filling defect identified in the pulmonary arteries to suggest acute pulmonary embolism. Mild to moderate cardiomegaly. Tortuous thoracic aorta with calcified atherosclerosis. Calcified coronary artery atherosclerosis is evident on series 5, image 179. Mediastinum/Nodes: No mediastinal mass or lymphadenopathy. Lungs/Pleura: Low lung volumes with atelectasis. Major airways remain patent. Evidence of underlying centrilobular emphysema in the upper lobes more apparent on the right. No pleural effusion. Upper Abdomen: Negative visible liver, spleen, stomach. Musculoskeletal: Disc and endplate degeneration in the visible cervical spine. No acute osseous abnormality identified. Review of the MIP images confirms the above findings. IMPRESSION: 1. Negative for acute pulmonary embolus. 2. Cardiomegaly. Calcified coronary artery and Aortic Atherosclerosis (ICD10-I70.0). 3. Low lung volumes with atelectasis. Electronically Signed   By: Odessa Fleming M.D.   On: 03/20/2021 04:30   DG Chest Port 1 View  Result Date: 03/20/2021 CLINICAL DATA:   Midsternal chest pain for several hours, initial encounter EXAM: PORTABLE CHEST 1 VIEW COMPARISON:  09/25/2020 FINDINGS: Cardiac shadow is within normal limits. Aortic calcifications are noted. Lungs are well aerated bilaterally. Minimal atelectatic changes in the left base are seen. No acute bony abnormality is noted. IMPRESSION: Minimal left basilar atelectasis. Electronically Signed   By: Alcide Clever M.D.   On: 03/20/2021 02:34   ECHOCARDIOGRAM COMPLETE  Result Date: 03/20/2021    ECHOCARDIOGRAM REPORT   Patient Name:   Sabrina Ellis Date of Exam: 03/20/2021 Medical Rec #:  [id number]            Height:       68.0 in Accession #:    [id number]           Weight:       220.0 lb Date of Birth:  Sep 13, 1955            BSA:          2.128 m Patient Age:    64 years             BP:           115/75 mmHg Patient Gender: F  HR:           58 bpm. [facility] Procedure: 2D Echo, Cardiac Doppler and Color Doppler Indications:    Chest Pain R07.9  History:        Patient has prior history of Echocardiogram examinations, most                 recent 09/26/2020. CAD and Previous Myocardial Infarction,                 Stroke; Risk Factors:Diabetes. Tobacco abuse (From Hx).  Sonographer:    Celesta Gentile RCS Referring Phys: [id number] OLADAPO ADEFESO IMPRESSIONS  1. Left ventricular ejection fraction, by estimation, is 35 to 40%. The left ventricle has moderately decreased function. The left ventricle demonstrates global hypokinesis. The left ventricular internal cavity size was mildly to moderately dilated. Left ventricular diastolic parameters are consistent with Grade I diastolic dysfunction (impaired relaxation).  2. Right ventricular systolic function is normal. The right ventricular size is normal. There is normal pulmonary artery systolic pressure.  3. Right atrial size was mildly dilated.  4. The mitral valve is grossly normal. Mild mitral valve regurgitation.  5. The aortic valve is  grossly normal. Aortic valve regurgitation is not visualized. No aortic stenosis is present. FINDINGS  Left Ventricle: Left ventricular ejection fraction, by estimation, is 35 to 40%. The left ventricle has moderately decreased function. The left ventricle demonstrates global hypokinesis. The left ventricular internal cavity size was mildly to moderately  dilated. There is borderline left ventricular hypertrophy. Left ventricular diastolic parameters are consistent with Grade I diastolic dysfunction (impaired relaxation). Right Ventricle: The right ventricular size is normal. Right vetricular wall thickness was not well visualized. Right ventricular systolic function is normal. There is normal pulmonary artery systolic pressure. The tricuspid regurgitant velocity is 2.18 m/s, and with an assumed right atrial pressure of 3 mmHg, the estimated right ventricular systolic pressure is 22.0 mmHg. Left Atrium: Left atrial size was normal in size. Right Atrium: Right atrial size was mildly dilated. Pericardium: There is no evidence of pericardial effusion. Mitral Valve: The mitral valve is grossly normal. Mild mitral valve regurgitation. Tricuspid Valve: The tricuspid valve is grossly normal. Tricuspid valve regurgitation is trivial. Aortic Valve: The aortic valve is grossly normal. Aortic valve regurgitation is not visualized. No aortic stenosis is present. Pulmonic Valve: The pulmonic valve was grossly normal. Pulmonic valve regurgitation is not visualized. Aorta: The aortic root and ascending aorta are structurally normal, with no evidence of dilitation. IAS/Shunts: The atrial septum is grossly normal.  LEFT VENTRICLE PLAX 2D LVIDd:         5.50 cm   Diastology LVIDs:         4.20 cm   LV e' medial:    4.68 cm/s LV PW:         1.10 cm   LV E/e' medial:  11.6 LV IVS:        1.00 cm   LV e' lateral:   8.16 cm/s LVOT diam:     2.00 cm   LV E/e' lateral: 6.7 LV SV:         66 LV SV Index:   31 LVOT Area:     3.14 cm  RIGHT  VENTRICLE RV S prime:     9.68 cm/s TAPSE (M-mode): 1.7 cm LEFT ATRIUM           Index        RIGHT ATRIUM  Index LA diam:      3.50 cm 1.64 cm/m   RA Area:     20.30 cm LA Vol (A2C): 66.2 ml 31.11 ml/m  RA Volume:   61.10 ml  28.71 ml/m LA Vol (A4C): 49.5 ml 23.26 ml/m  AORTIC VALVE LVOT Vmax:   98.20 cm/s LVOT Vmean:  60.000 cm/s LVOT VTI:    0.211 m  AORTA Ao Root diam: 3.70 cm MITRAL VALVE               TRICUSPID VALVE MV Area (PHT): 2.56 cm    TR Peak grad:   19.0 mmHg MV Decel Time: 296 msec    TR Vmax:        218.00 cm/s MV E velocity: 54.50 cm/s MV A velocity: 75.40 cm/s  SHUNTS MV E/A ratio:  0.72        Systemic VTI:  0.21 m                            Systemic Diam: 2.00 cm Kristeen Miss MD Electronically signed by Kristeen Miss MD Signature Date/Time: 03/20/2021/3:39:33 PM    Final      Discharge Exam: Vitals:   03/20/21 1400 03/20/21 1433  BP: 130/83 (!) 158/90  Pulse: 73 91  Resp: 18   Temp: 98.4 F (36.9 C) 97.7 F (36.5 C)  SpO2: 95% 100%   Vitals:   03/20/21 1300 03/20/21 1330 03/20/21 1400 03/20/21 1433  BP: 138/67 110/65 130/83 (!) 158/90  Pulse: 71  73 91  Resp: 18  18   Temp:   98.4 F (36.9 C) 97.7 F (36.5 C)  TempSrc:   Oral Oral  SpO2: 96%  95% 100%  Weight:      Height:        General: Pt is alert, awake, not in acute distress Cardiovascular: RRR, S1/S2 +, no rubs, no gallops Respiratory: CTA bilaterally, no wheezing, no rhonchi Abdominal: Soft, NT, ND, bowel sounds + Extremities: no edema, no cyanosis    The results of significant diagnostics from this hospitalization (including imaging, microbiology, ancillary and laboratory) are listed below for reference.     Microbiology: Recent Results (from the past 240 hour(s))  Resp Panel by RT-PCR (Flu A&B, Covid) Nasopharyngeal Swab     Status: None   Collection Time: 03/20/21  4:51 AM   Specimen: Nasopharyngeal Swab; Nasopharyngeal(NP) swabs in vial transport medium  Result Value Ref Range  Status   SARS Coronavirus 2 by RT PCR NEGATIVE NEGATIVE Final    Comment: (NOTE) SARS-CoV-2 target nucleic acids are NOT DETECTED.  The SARS-CoV-2 RNA is generally detectable in upper respiratory specimens during the acute phase of infection. The lowest concentration of SARS-CoV-2 viral copies this assay can detect is 138 copies/mL. A negative result does not preclude SARS-Cov-2 infection and should not be used as the sole basis for treatment or other patient management decisions. A negative result may occur with  improper specimen collection/handling, submission of specimen other than nasopharyngeal swab, presence of viral mutation(s) within the areas targeted by this assay, and inadequate number of viral copies(<138 copies/mL). A negative result must be combined with clinical observations, patient history, and epidemiological information. The expected result is Negative.  Fact Sheet for Patients:  BloggerCourse.com  Fact Sheet for Healthcare Providers:  SeriousBroker.it  This test is no t yet approved or cleared by the [place] FDA and  has been authorized for detection and/or diagnosis of SARS-CoV-2  by FDA under an Emergency Use Authorization (EUA). This EUA will remain  in effect (meaning this test can be used) for the duration of the COVID-19 declaration under Section 564(b)(1) of the Act, 21 U.S.C.section 360bbb-3(b)(1), unless the authorization is terminated  or revoked sooner.       Influenza A by PCR NEGATIVE NEGATIVE Final   Influenza B by PCR NEGATIVE NEGATIVE Final    Comment: (NOTE) The Xpert Xpress SARS-CoV-2/FLU/RSV plus assay is intended as an aid in the diagnosis of influenza from Nasopharyngeal swab specimens and should not be used as a sole basis for treatment. Nasal washings and aspirates are unacceptable for Xpert Xpress SARS-CoV-2/FLU/RSV testing.  Fact Sheet for  Patients: BloggerCourse.com  Fact Sheet for Healthcare Providers: SeriousBroker.it  This test is not yet approved or cleared by the [place] FDA and has been authorized for detection and/or diagnosis of SARS-CoV-2 by FDA under an Emergency Use Authorization (EUA). This EUA will remain in effect (meaning this test can be used) for the duration of the COVID-19 declaration under Section 564(b)(1) of the Act, 21 U.S.C. section 360bbb-3(b)(1), unless the authorization is terminated or revoked.  Performed at [facility], [address] [place], [place] [address]      Labs: BNP (last 3 results) No results for input(s): BNP in the last 8760 hours. Basic Metabolic Panel: Recent Labs  Lab 03/20/21 0138  NA 134*  K 3.4*  CL 103  CO2 24  GLUCOSE 223*  BUN 15  CREATININE 0.69  CALCIUM 8.9   Liver Function Tests: No results for input(s): AST, ALT, ALKPHOS, BILITOT, PROT, ALBUMIN in the last 168 hours. No results for input(s): LIPASE, AMYLASE in the last 168 hours. No results for input(s): AMMONIA in the last 168 hours. CBC: Recent Labs  Lab 03/20/21 0138  WBC 5.2  HGB 11.6*  HCT 35.4*  MCV 91.0  PLT 248   Cardiac Enzymes: No results for input(s): CKTOTAL, CKMB, CKMBINDEX, TROPONINI in the last 168 hours. BNP: Invalid input(s): POCBNP CBG: No results for input(s): GLUCAP in the last 168 hours. D-Dimer No results for input(s): DDIMER in the last 72 hours. Hgb A1c Recent Labs    03/20/21 0145  HGBA1C 9.7*   Lipid Profile Recent Labs    03/20/21 0145  CHOL 172  HDL 35*  LDLCALC 116*  TRIG 107  CHOLHDL 4.9   Thyroid function studies No results for input(s): TSH, T4TOTAL, T3FREE, THYROIDAB in the last 72 hours.  Invalid input(s): FREET3 Anemia work up No results for input(s): VITAMINB12, FOLATE, FERRITIN, TIBC, IRON, RETICCTPCT in the last 72 hours. Urinalysis No results found for: COLORURINE,  APPEARANCEUR, LABSPEC, PHURINE, GLUCOSEU, HGBUR, BILIRUBINUR, KETONESUR, PROTEINUR, UROBILINOGEN, NITRITE, LEUKOCYTESUR Sepsis Labs Invalid input(s): PROCALCITONIN,  WBC,  LACTICIDVEN Microbiology Recent Results (from the past 240 hour(s))  Resp Panel by RT-PCR (Flu A&B, Covid) Nasopharyngeal Swab     Status: None   Collection Time: 03/20/21  4:51 AM   Specimen: Nasopharyngeal Swab; Nasopharyngeal(NP) swabs in vial transport medium  Result Value Ref Range Status   SARS Coronavirus 2 by RT PCR NEGATIVE NEGATIVE Final    Comment: (NOTE) SARS-CoV-2 target nucleic acids are NOT DETECTED.  The SARS-CoV-2 RNA is generally detectable in upper respiratory specimens during the acute phase of infection. The lowest concentration of SARS-CoV-2 viral copies this assay can detect is 138 copies/mL. A negative result does not preclude SARS-Cov-2 infection and should not be used as the sole basis for treatment or other patient management decisions.  A negative result may occur with  improper specimen collection/handling, submission of specimen other than nasopharyngeal swab, presence of viral mutation(s) within the areas targeted by this assay, and inadequate number of viral copies(<138 copies/mL). A negative result must be combined with clinical observations, patient history, and epidemiological information. The expected result is Negative.  Fact Sheet for Patients:  BloggerCourse.com  Fact Sheet for Healthcare Providers:  SeriousBroker.it  This test is no t yet approved or cleared by the [place] FDA and  has been authorized for detection and/or diagnosis of SARS-CoV-2 by FDA under an Emergency Use Authorization (EUA). This EUA will remain  in effect (meaning this test can be used) for the duration of the COVID-19 declaration under Section 564(b)(1) of the Act, 21 U.S.C.section 360bbb-3(b)(1), unless the authorization is terminated  or  revoked sooner.       Influenza A by PCR NEGATIVE NEGATIVE Final   Influenza B by PCR NEGATIVE NEGATIVE Final    Comment: (NOTE) The Xpert Xpress SARS-CoV-2/FLU/RSV plus assay is intended as an aid in the diagnosis of influenza from Nasopharyngeal swab specimens and should not be used as a sole basis for treatment. Nasal washings and aspirates are unacceptable for Xpert Xpress SARS-CoV-2/FLU/RSV testing.  Fact Sheet for Patients: BloggerCourse.com  Fact Sheet for Healthcare Providers: SeriousBroker.it  This test is not yet approved or cleared by the [place] FDA and has been authorized for detection and/or diagnosis of SARS-CoV-2 by FDA under an Emergency Use Authorization (EUA). This EUA will remain in effect (meaning this test can be used) for the duration of the COVID-19 declaration under Section 564(b)(1) of the Act, 21 U.S.C. section 360bbb-3(b)(1), unless the authorization is terminated or revoked.  Performed at [facility], [address] [place], [place] [address]      Time coordinating discharge: 35 minutes  SIGNED:   Erick Blinks, DO [facility] 03/20/2021, 3:52 PM  If 7PM-7AM, please contact night-coverage [email or web address]

## 2021-03-20 NOTE — ED Provider Notes (Signed)
[facility] EMERGENCY DEPARTMENT Provider Note   CSN: [id number] Arrival date & time: 03/20/21  0121     History Chief Complaint  Patient presents with   Chest Pain    Barb Kendrick-Comer is a 65 y.o. female.  The history is provided by the patient.  Chest Pain Pain location:  Substernal area Pain quality: sharp   Radiates to: Back. Pain severity:  Moderate Onset quality:  Sudden Duration:  4 hours Timing:  Constant Progression:  Improving Chronicity:  New Relieved by:  Nitroglycerin Worsened by:  Nothing Associated symptoms: shortness of breath   Associated symptoms: no fever, no vomiting and no weakness   Patient with known history of CAD, previous stroke presents with chest pain.  Patient reports she was resting at home when she had onset of chest pain in the substernal region and into her back.  She reports it was mostly sharp and did seem worse with breathing.  She also reports shortness of breath.  No fevers or vomiting.  No recent cough or cold symptoms.  No hemoptysis.  She reports the pain is now improving.  No new weakness or in extremities.  Patient reports she does not get chest pain frequently    Past Medical History:  Diagnosis Date   STEMI (ST elevation myocardial infarction) ([place]) 09/25/2020   Stroke ([place])    Tobacco abuse stopped 5 yrs ago     Patient Active Problem List   Diagnosis Date Noted   Cerebral thrombosis with cerebral infarction 10/01/2020   Cerebral embolism with cerebral infarction 10/01/2020   Diabetes mellitus due to underlying condition with hyperosmolarity without coma, without long-term current use of insulin (HCC)    Goals of care, counseling/discussion    STEMI (ST elevation myocardial infarction) ([place]) 09/25/2020   Acute ST elevation myocardial infarction (STEMI) of inferior wall ([place]) 09/25/2020    Past Surgical History:  Procedure Laterality Date   APPENDECTOMY     CORONARY STENT INTERVENTION N/A 09/25/2020   Procedure:  CORONARY STENT INTERVENTION;  Surgeon: Burnell Blanks, MD;  Location: [place] [facility];  Service: Cardiovascular;  Laterality: N/A;   HYSTERECTOMY ABDOMINAL WITH SALPINGO-OOPHORECTOMY Bilateral    LEFT HEART CATH AND CORONARY ANGIOGRAPHY N/A 09/25/2020   Procedure: LEFT HEART CATH AND CORONARY ANGIOGRAPHY;  Surgeon: Burnell Blanks, MD;  Location: [place] [facility];  Service: Cardiovascular;  Laterality: N/A;     OB History   No obstetric history on file.     Family History  Problem Relation Age of Onset   Hypertension Mother     Social History   Tobacco Use   Smoking status: Former    Years: 45.00    Types: Cigarettes    Quit date: 04/2015    Years since quitting: 5.9   Smokeless tobacco: Never  Substance Use Topics   Alcohol use: Not Currently   Drug use: Not Currently    Home Medications Prior to Admission medications   Not on File    Allergies    Latex, Cashew nut oil, and Tomato  Review of Systems   Review of Systems  Constitutional:  Negative for fever.  Respiratory:  Positive for shortness of breath.   Cardiovascular:  Positive for chest pain. Negative for leg swelling.  Gastrointestinal:  Negative for vomiting.  Neurological:  Negative for weakness.  All other systems reviewed and are negative.  Physical Exam Updated Vital Signs BP (!) 144/88   Pulse 74   Temp 98.3 F (36.8 C) (Oral)  Resp 19   Ht 1.727 m (5\' 8" )   Wt 99.8 kg   SpO2 98%   BMI 33.45 kg/m   Physical Exam CONSTITUTIONAL: Elderly, patient resting comfortably HEAD: Normocephalic/atraumatic EYES: EOMI/PERRL ENMT: Mucous membranes moist NECK: supple no meningeal signs SPINE/BACK:entire spine nontender, no reproducible back pain CV: S1/S2 noted, no murmurs/rubs/gallops noted LUNGS: Lungs are clear to auscultation bilaterally, no apparent distress ABDOMEN: soft, nontender, no rebound or guarding, bowel sounds noted throughout abdomen GU:no cva tenderness NEURO:  Pt is awake/alert/appropriate, moves all extremitiesx4.  No facial droop.   EXTREMITIES: pulses normal/equalx4, full ROM, no calf tenderness SKIN: warm, color normal PSYCH: no abnormalities of mood noted, alert and oriented to situation  ED Results / Procedures / Treatments   Labs (all labs ordered are listed, but only abnormal results are displayed) Labs Reviewed  BASIC METABOLIC PANEL - Abnormal; Notable for the following components:      Result Value   Sodium 134 (*)    Potassium 3.4 (*)    Glucose, Bld 223 (*)    All other components within normal limits  CBC - Abnormal; Notable for the following components:   Hemoglobin 11.6 (*)    HCT 35.4 (*)    All other components within normal limits  RESP PANEL BY RT-PCR (FLU A&B, COVID) ARPGX2  TROPONIN I (HIGH SENSITIVITY)  TROPONIN I (HIGH SENSITIVITY)    EKG EKG Interpretation  Date/Time:  Saturday March 20 2021 01:34:51 EST Ventricular Rate:  90 PR Interval:  195 QRS Duration: 89 QT Interval:  357 QTC Calculation: 437 R Axis:   -31 Text Interpretation: Sinus rhythm Probable left atrial enlargement Left axis deviation Abnormal R-wave progression, early transition Confirmed by Ripley Fraise [id number]) on 03/20/2021 2:02:49 AM  Radiology CT Angio Chest PE W and/or Wo Contrast  Result Date: 03/20/2021 CLINICAL DATA:  65 year old female with midsternal chest pain since 2200 hours. Pain radiating to the back. Coronary artery disease. EXAM: CT ANGIOGRAPHY CHEST WITH CONTRAST TECHNIQUE: Multidetector CT imaging of the chest was performed using the standard protocol during bolus administration of intravenous contrast. Multiplanar CT image reconstructions and MIPs were obtained to evaluate the vascular anatomy. CONTRAST:  156mL OMNIPAQUE IOHEXOL 350 MG/ML SOLN COMPARISON:  CTA head and neck 10/01/2020. Portable chest 0227 hours. FINDINGS: Cardiovascular: Good contrast bolus timing in the pulmonary arterial tree. No focal filling defect  identified in the pulmonary arteries to suggest acute pulmonary embolism. Mild to moderate cardiomegaly. Tortuous thoracic aorta with calcified atherosclerosis. Calcified coronary artery atherosclerosis is evident on series 5, image 179. Mediastinum/Nodes: No mediastinal mass or lymphadenopathy. Lungs/Pleura: Low lung volumes with atelectasis. Major airways remain patent. Evidence of underlying centrilobular emphysema in the upper lobes more apparent on the right. No pleural effusion. Upper Abdomen: Negative visible liver, spleen, stomach. Musculoskeletal: Disc and endplate degeneration in the visible cervical spine. No acute osseous abnormality identified. Review of the MIP images confirms the above findings. IMPRESSION: 1. Negative for acute pulmonary embolus. 2. Cardiomegaly. Calcified coronary artery and Aortic Atherosclerosis (ICD10-I70.0). 3. Low lung volumes with atelectasis. Electronically Signed   By: Genevie Ann M.D.   On: 03/20/2021 04:30   DG Chest Port 1 View  Result Date: 03/20/2021 CLINICAL DATA:  Midsternal chest pain for several hours, initial encounter EXAM: PORTABLE CHEST 1 VIEW COMPARISON:  09/25/2020 FINDINGS: Cardiac shadow is within normal limits. Aortic calcifications are noted. Lungs are well aerated bilaterally. Minimal atelectatic changes in the left base are seen. No acute bony abnormality is noted. IMPRESSION: Minimal  left basilar atelectasis. Electronically Signed   By: Alcide Clever M.D.   On: 03/20/2021 02:34    Procedures Procedures   Medications Ordered in ED Medications  aspirin chewable tablet 324 mg (has no administration in time range)  iohexol (OMNIPAQUE) 350 MG/ML injection 100 mL (100 mLs Intravenous Contrast Given 03/20/21 0409)    ED Course  I have reviewed the triage vital signs and the nursing notes.  Pertinent labs & imaging results that were available during my care of the patient were reviewed by me and considered in my medical decision making (see chart  for details).    MDM Rules/Calculators/A&P                           Patient presents for chest pain.  Her records indicate that patient had a STEMI last June and was found to have multivessel CAD.  She was recommended for CABG, but patient declined and signed out AGAINST MEDICAL ADVICE.  She reports she is a DNR, and she  did not appreciate the conversation she had with her cardiac physicians on the previous admission  Currently patient is resting comfortably.  Her initial troponin is unremarkable.  No STEMI on EKG However due to the symptoms of pleuritic chest pain, will proceed with CT angio chest. 5:34 AM CT chest negative for acute process Patient remains chest pain-free.  However due to significant history of CAD I recommended admission.  Patient also reports she has not been taking any medications since her last hospitalization. Discussed with Dr. Thomes Dinning for admission.  Patient will benefit from monitoring and cardiology consultation.  Patient has stated she would not like to be transferred to [facility] Final Clinical Impression(s) / ED Diagnoses Final diagnoses:  Unstable angina [facility])    Rx / DC Orders ED Discharge Orders     None        Zadie Rhine, MD 03/20/21 [phone]

## 2021-03-20 NOTE — ED Notes (Signed)
Report given to Leotis Shames, RN. Pt transported to floor by ED tech.

## 2021-03-20 NOTE — ED Triage Notes (Addendum)
Pt c/o mid-sternal Chest pain since 2200 last night. States pain radiates through to her back. Pt states she was supposed to have open heart sx but she signed out AMA. Pt given 2 SL NTG en route by EMS.

## 2021-03-20 NOTE — H&P (Signed)
History and Physical  Sabrina Ellis [id number] DOB: 27-Jun-1955 DOA: 03/20/2021  Referring physician: Ripley Fraise, MD PCP: Pcp, No  Patient coming from: Home  Chief Complaint: Chest pain    HPI: Sabrina Ellis is a 65 y.o. female with medical history significant for STEMI, prior tobacco use (stopped 5 years ago) who presents to the emergency department due to right sided upper back pain and left sided sharp nonradiating, nonreproducible chest pain which was rated as 9/10 on pain scale with forceful palpitation which started while lying in bed at home yesterday evening.  Pain was aggravated with changing position and was associated with shortness of breath.  She already took 2 baby aspirin prior to onset of the pain.  She states that she tried to stay calm with hope that the pain will subside, she decided to activate EMS due to the unrelenting chest pain.  On arrival of EMS team, nitroglycerin x2 was given with complete relief of the chest pain. Patient was admitted to [facility] from 6/10-6/17 due to STEMI, left heart catheterization done at that time showed Mid Cx lesion is 80% stenosed, Prox LAD lesion is 70% stenosed, Ost RCA to Dist RCA lesion is 100% stenosed.  PCI of the RCA was not done at that time due to total occlusion of the proximal vessel.  CABG was recommended at that time but patient refused and left AMA.  ED Course:  In the emergency department, she was hemodynamically stable, work-up in the ED normocytic anemia, hypokalemia, mild hyponatremia, and hyperglycemia.  Troponin x2 was flat at 11. Chest x-ray showed minimal left basilar atelectasis CT angiography of chest with contrast was negative for acute pulmonary embolus Aspirin 324 mg x 1 was given.  Hospitalist was asked to admit patient for further evaluation and management  Review of Systems: Constitutional: Negative for chills and fever.  HENT: Negative for ear pain and sore throat.   Eyes: Negative for  pain and visual disturbance.  Respiratory: Positive for shortness of breath.  Negative for cough Cardiovascular: Positive for chest pain and palpitations.  Gastrointestinal: Negative for abdominal pain and vomiting.  Endocrine: Negative for polyphagia and polyuria.  Genitourinary: Negative for decreased urine volume, dysuria, enuresis Musculoskeletal: Negative for arthralgias and back pain.  Skin: Negative for color change and rash.  Allergic/Immunologic: Negative for immunocompromised state.  Neurological: Negative for tremors, syncope, speech difficulty, weakness, light-headedness and headaches.  Hematological: Does not bruise/bleed easily.  All other systems reviewed and are negative   Past Medical History:  Diagnosis Date   STEMI (ST elevation myocardial infarction) ([place]) 09/25/2020   Stroke ([place])    Tobacco abuse stopped 5 yrs ago    Past Surgical History:  Procedure Laterality Date   APPENDECTOMY     CORONARY STENT INTERVENTION N/A 09/25/2020   Procedure: CORONARY STENT INTERVENTION;  Surgeon: Burnell Blanks, MD;  Location: [place] [facility];  Service: Cardiovascular;  Laterality: N/A;   HYSTERECTOMY ABDOMINAL WITH SALPINGO-OOPHORECTOMY Bilateral    LEFT HEART CATH AND CORONARY ANGIOGRAPHY N/A 09/25/2020   Procedure: LEFT HEART CATH AND CORONARY ANGIOGRAPHY;  Surgeon: Burnell Blanks, MD;  Location: [place] [facility];  Service: Cardiovascular;  Laterality: N/A;    Social History:  reports that she quit smoking about 5 years ago. Her smoking use included cigarettes. She has never used smokeless tobacco. She reports that she does not currently use alcohol. She reports that she does not currently use drugs.   Allergies  Allergen Reactions   Latex Swelling  Cashew Nut Oil Itching and Rash   Tomato Itching and Rash    Family History  Problem Relation Age of Onset   Hypertension Mother      Prior to Admission medications   Not on File    Physical  Exam: BP 105/69   Pulse 62   Temp 98.3 F (36.8 C) (Oral)   Resp 18   Ht 5\' 8"  (1.727 m)   Wt 99.8 kg   SpO2 97%   BMI 33.45 kg/m   General: 65 y.o. year-old female well developed well nourished in no acute distress.  Alert and oriented x3. HEENT: NCAT, EOMI Neck: Supple, trachea medial Cardiovascular: Regular rate and rhythm with no rubs or gallops.  No thyromegaly or JVD noted.  No lower extremity edema. 2/4 pulses in all 4 extremities. Respiratory: Clear to auscultation with no wheezes or rales. Good inspiratory effort. Abdomen: Soft, nontender nondistended with normal bowel sounds x4 quadrants. Muskuloskeletal: No cyanosis, clubbing or edema noted bilaterally Neuro: CN II-XII intact, strength 5/5 x 4, sensation, reflexes intact Skin: No ulcerative lesions noted or rashes Psychiatry: Judgement and insight appear normal. Mood is appropriate for condition and setting          Labs on Admission:  Basic Metabolic Panel: Recent Labs  Lab 03/20/21 0138  NA 134*  K 3.4*  CL 103  CO2 24  GLUCOSE 223*  BUN 15  CREATININE 0.69  CALCIUM 8.9   Liver Function Tests: No results for input(s): AST, ALT, ALKPHOS, BILITOT, PROT, ALBUMIN in the last 168 hours. No results for input(s): LIPASE, AMYLASE in the last 168 hours. No results for input(s): AMMONIA in the last 168 hours. CBC: Recent Labs  Lab 03/20/21 0138  WBC 5.2  HGB 11.6*  HCT 35.4*  MCV 91.0  PLT 248   Cardiac Enzymes: No results for input(s): CKTOTAL, CKMB, CKMBINDEX, TROPONINI in the last 168 hours.  BNP (last 3 results) No results for input(s): BNP in the last 8760 hours.  ProBNP (last 3 results) No results for input(s): PROBNP in the last 8760 hours.  CBG: No results for input(s): GLUCAP in the last 168 hours.  Radiological Exams on Admission: CT Angio Chest PE W and/or Wo Contrast  Result Date: 03/20/2021 CLINICAL DATA:  65 year old female with midsternal chest pain since 2200 hours. Pain radiating  to the back. Coronary artery disease. EXAM: CT ANGIOGRAPHY CHEST WITH CONTRAST TECHNIQUE: Multidetector CT imaging of the chest was performed using the standard protocol during bolus administration of intravenous contrast. Multiplanar CT image reconstructions and MIPs were obtained to evaluate the vascular anatomy. CONTRAST:  77 OMNIPAQUE IOHEXOL 350 MG/ML SOLN COMPARISON:  CTA head and neck 10/01/2020. Portable chest 0227 hours. FINDINGS: Cardiovascular: Good contrast bolus timing in the pulmonary arterial tree. No focal filling defect identified in the pulmonary arteries to suggest acute pulmonary embolism. Mild to moderate cardiomegaly. Tortuous thoracic aorta with calcified atherosclerosis. Calcified coronary artery atherosclerosis is evident on series 5, image 179. Mediastinum/Nodes: No mediastinal mass or lymphadenopathy. Lungs/Pleura: Low lung volumes with atelectasis. Major airways remain patent. Evidence of underlying centrilobular emphysema in the upper lobes more apparent on the right. No pleural effusion. Upper Abdomen: Negative visible liver, spleen, stomach. Musculoskeletal: Disc and endplate degeneration in the visible cervical spine. No acute osseous abnormality identified. Review of the MIP images confirms the above findings. IMPRESSION: 1. Negative for acute pulmonary embolus. 2. Cardiomegaly. Calcified coronary artery and Aortic Atherosclerosis (ICD10-I70.0). 3. Low lung volumes with atelectasis. Electronically Signed  By: Genevie Ann M.D.   On: 03/20/2021 04:30   DG Chest Port 1 View  Result Date: 03/20/2021 CLINICAL DATA:  Midsternal chest pain for several hours, initial encounter EXAM: PORTABLE CHEST 1 VIEW COMPARISON:  09/25/2020 FINDINGS: Cardiac shadow is within normal limits. Aortic calcifications are noted. Lungs are well aerated bilaterally. Minimal atelectatic changes in the left base are seen. No acute bony abnormality is noted. IMPRESSION: Minimal left basilar atelectasis.  Electronically Signed   By: Inez Catalina M.D.   On: 03/20/2021 02:34    EKG: I independently viewed the EKG done and my findings are as followed: Normal sinus rhythm at a rate of 90 bpm with possible left atrial enlargement.  Assessment/Plan Present on Admission: **None**  Active Problems:   Chest pain   History of ST elevation myocardial infarction (STEMI)   HFrEF (heart failure with reduced ejection fraction) (HCC)   Hypokalemia   Hyperglycemia  Chest pain Patient complained of left-sided sharp chest pain which was nonradiating, nonreproducible and was relieved with nitroglycerin Cardiovascular risk factors include history of NSTEMI  Continue telemetry  Troponins x2 -was flat at 11 EKG personally reviewed showed normal sinus rhythm at a rate of 90 bpm with possible left atrial enlargement Patient does not want to be admitted to [facility] due to leaving AMA when told that she needed CABG in June of this year. Consider telephone cardiology consult regarding meds and outpatient follow-up Continue aspirin, nitroglycerin prn  Hypokalemia K+ is 3.4 K+ will be replenished Please monitor for AM K+ for further replenishmemnt  Hyperglycemia possibly reactive, rule out type II DM CBG 223, hemoglobin A1c done in June 2022 was 14.2 Patient did not seem to be on any insulin or IN any home medication This will be repeated and if still elevated, she will be started on insulin  History of STEMI HFrEF Echocardiogram done in June showed LVEF of 40-45% Patient left AMA and has not seen any cardiologist since then.  She did not want to be admitted to [facility] EKG was without any significant changes Continue aspirin and statin; patient will need ACE inhibitor and beta-blocker, however BP is currently soft Consider phone consult with a cardiologist and arrange for outpatient cardiology follow-up   Echo 09/26/2020  1. Poor quality images even with definity Markedly abnormal septal motion   inferior wall hypokinesis . Left ventricular ejection fraction, by  estimation, is 40 to 45%. The left ventricle has mildly decreased  function. The left ventricle demonstrates  regional wall motion abnormalities (see scoring diagram/findings for  description). Left ventricular diastolic parameters were normal.   2. Right ventricular systolic function is normal. The right ventricular  size is normal.   3. Right atrial size was mildly dilated.   4. The mitral valve is abnormal. Trivial mitral valve regurgitation. No  evidence of mitral stenosis.   5. The aortic valve is tricuspid. There is mild calcification of the  aortic valve. Aortic valve regurgitation is not visualized. Mild aortic  valve sclerosis is present, with no evidence of aortic valve stenosis.   6. The inferior vena cava is normal in size with greater than 50%  respiratory variability, suggesting right atrial pressure of 3 mmHg.    DVT prophylaxis: Lovenox  Code Status: Full code  Family Communication: None at bedside  Disposition Plan:  Patient is from:                        home Anticipated  DC to:                   SNF or family members home Anticipated DC date:               2-3 days Anticipated DC barriers:         Patient requires inpatient management due to chest pain requiring further work-up and medication adjustment  Consults called: None   Admission status: Observation   Bernadette Hoit MD [facility]  03/20/2021, 6:12 AM

## 2021-03-20 NOTE — ED Notes (Signed)
Pt continues to sleep. Awakens easily and denies any needs at this time. Call bell in reach

## 2021-03-25 ENCOUNTER — Ambulatory Visit: Payer: Medicare Other | Admitting: Cardiology

## 2022-05-06 IMAGING — DX DG CHEST 1V PORT
1 series · 1 of 1 positions shown · non-contrast
Comparison: None.

CLINICAL DATA: Chest pain

EXAM:
PORTABLE CHEST 1 VIEW

[chest]
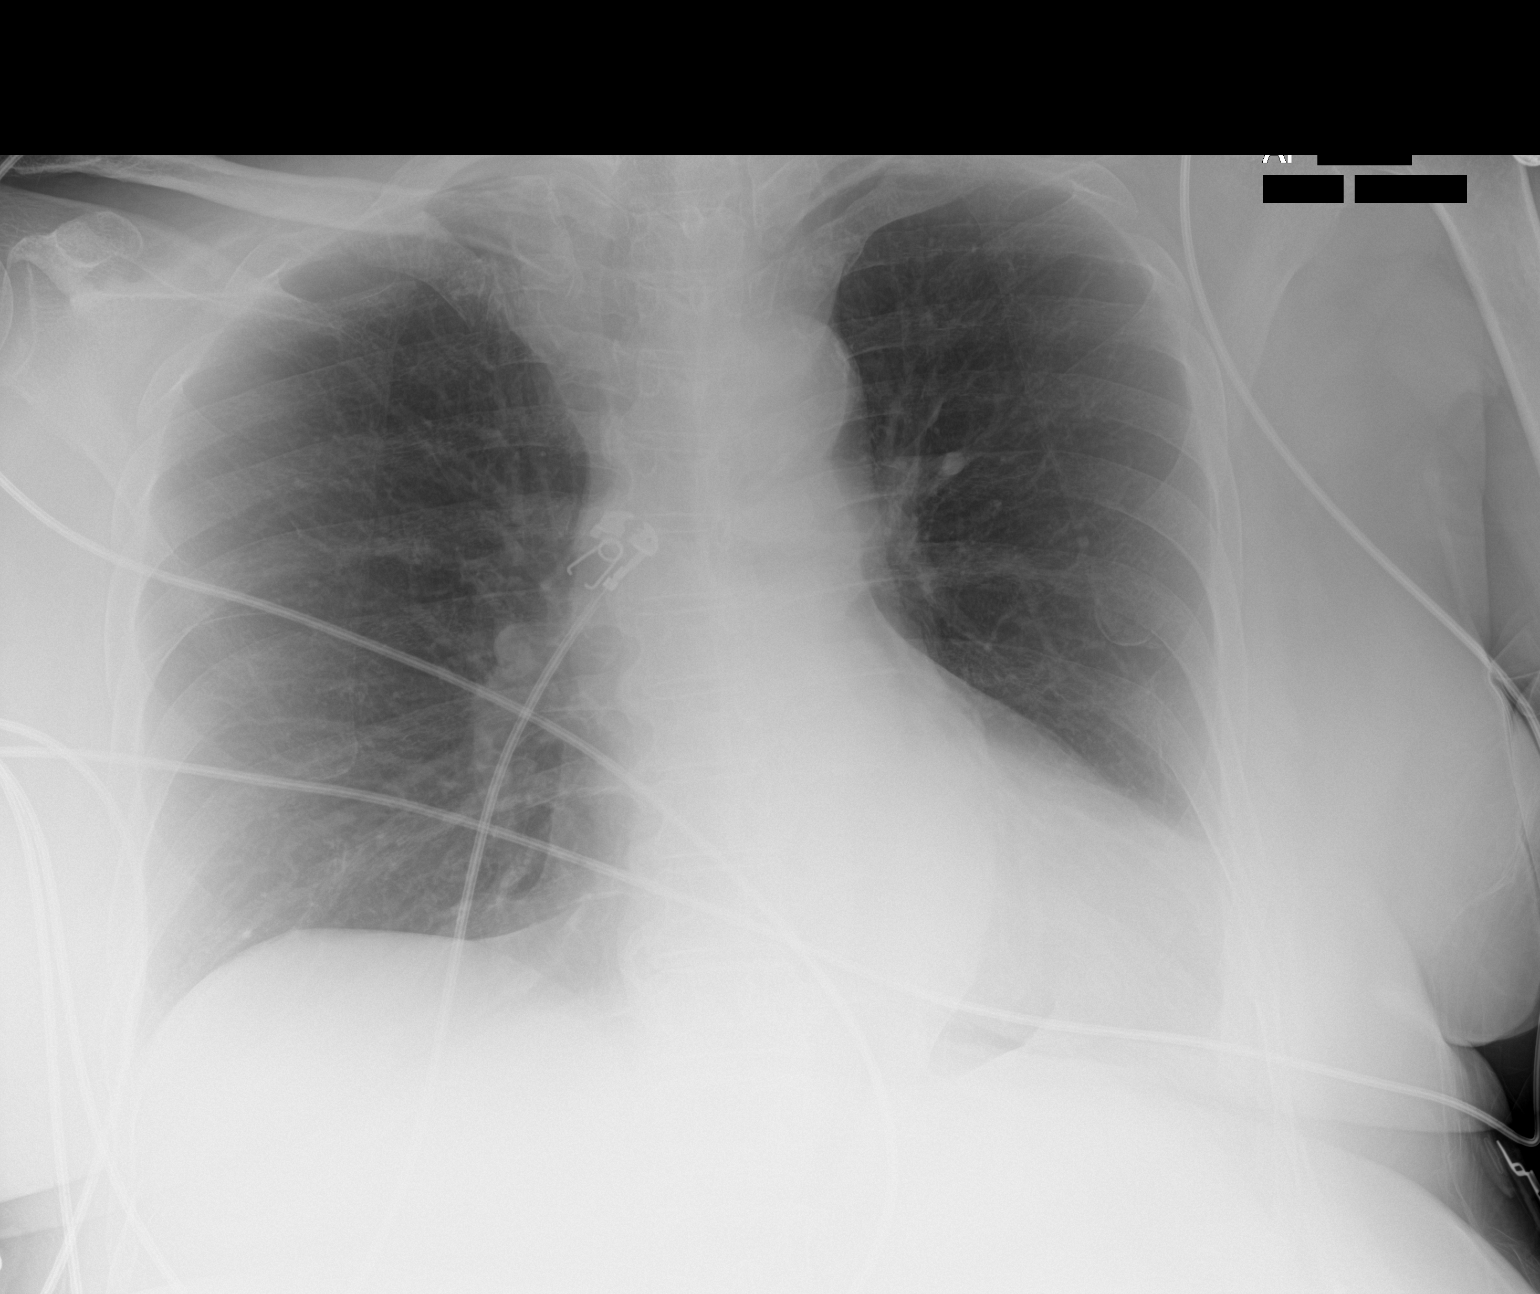

[1 of 1 positions shown; findings below may reference images not displayed]

FINDINGS: Heart and mediastinal contours are within normal limits. No focal
opacities or effusions. No acute bony abnormality.
IMPRESSION: No active disease.

## 2022-10-29 IMAGING — CT CT ANGIO CHEST
2 of 6 series · 18 of 46 positions shown · IV contrast (Omnipaque or Isovue)
Comparison: CTA head and neck 10/01/2020.

Portable chest 4881 hours.

CLINICAL DATA: 64-year-old female with midsternal chest pain since
2266 hours. Pain radiating to the back. Coronary artery disease.

EXAM:
CT ANGIOGRAPHY CHEST WITH CONTRAST
TECHNIQUE: Multidetector CT imaging of the chest was performed using the
standard protocol during bolus administration of intravenous
contrast. Multiplanar CT image reconstructions and MIPs were
obtained to evaluate the vascular anatomy.
CONTRAST:  100mL OMNIPAQUE IOHEXOL 350 MG/ML SOLN

[Series 5: pe axial thins · axial · 0.74mm/px · z∈[+1094,+1329]mm · 15 of 322 slices shown]
[im 14/322  lung]
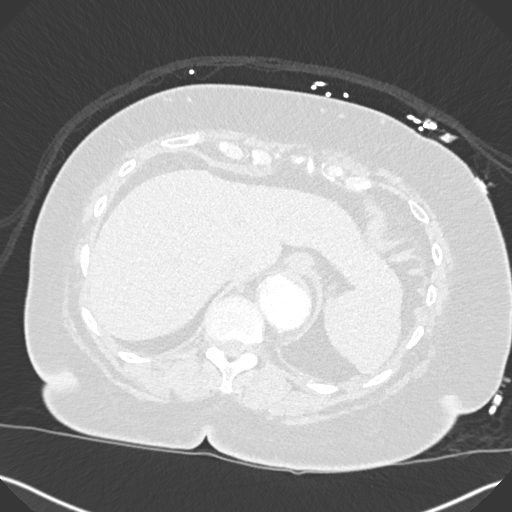
[im 41/322  soft-tissue]
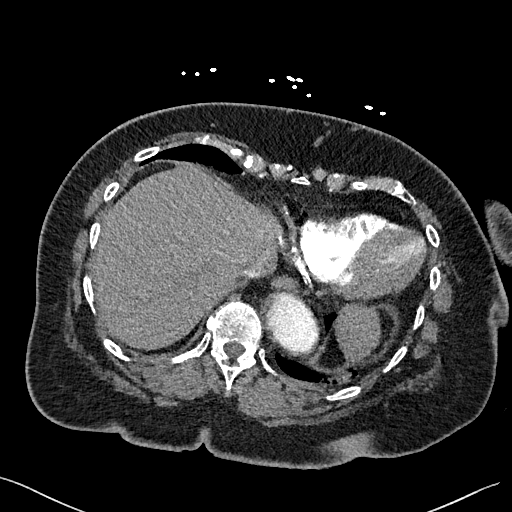
[im 54/322  lung]
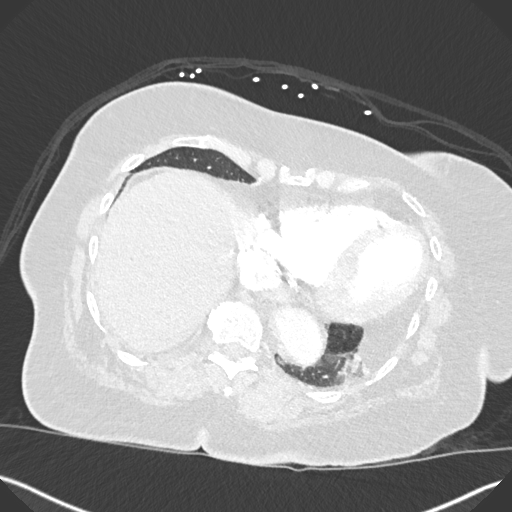
[im 81/322  soft-tissue]
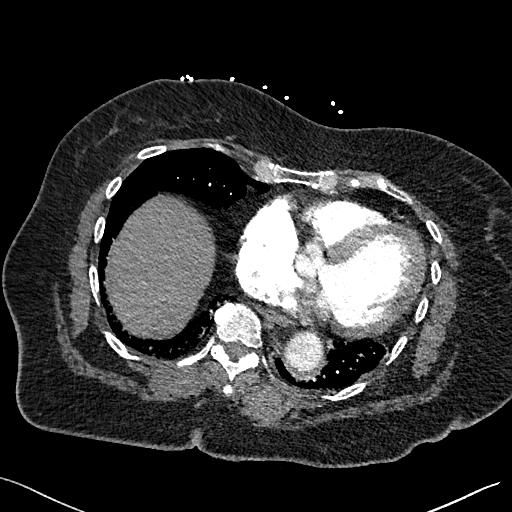
[im 94/322  lung]
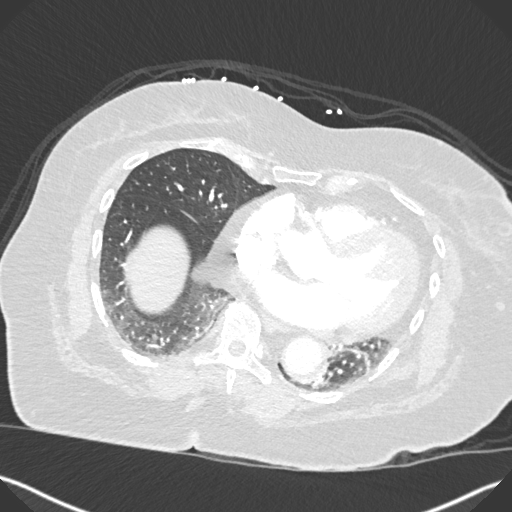
[im 121/322  soft-tissue]
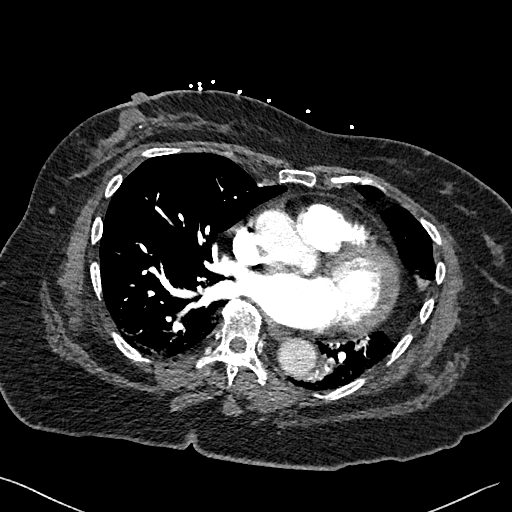
[im 134/322  lung]
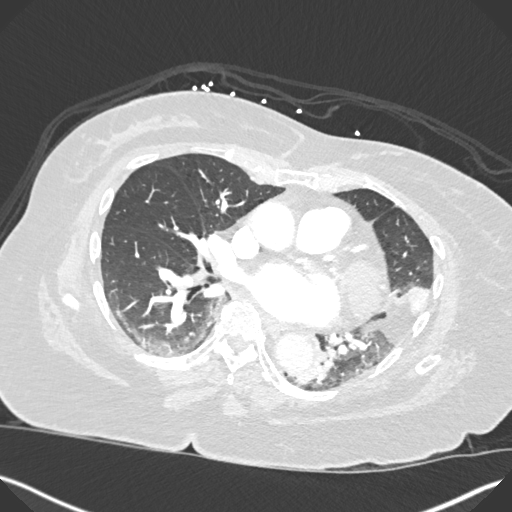
[im 161/322  soft-tissue]
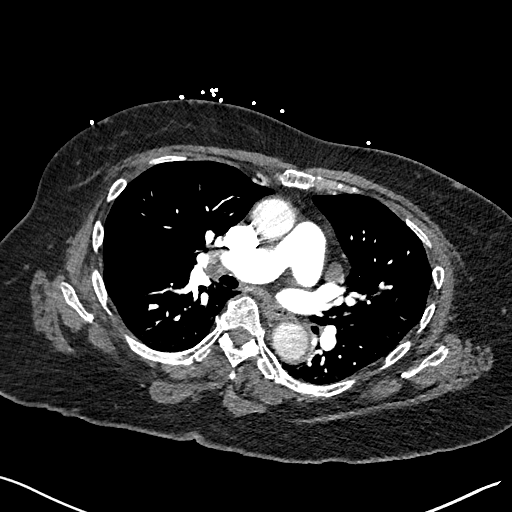
[im 188/322  lung]
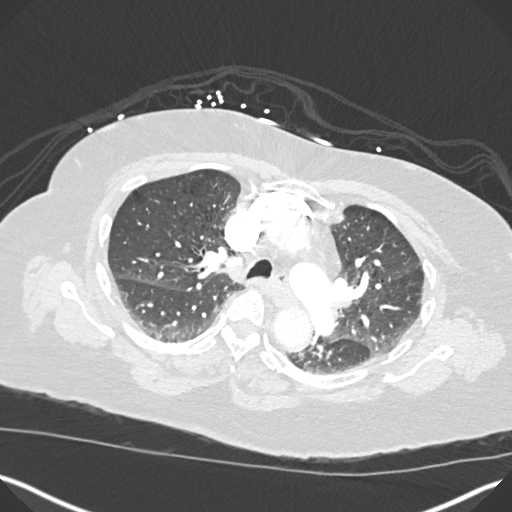
[im 201/322  soft-tissue]
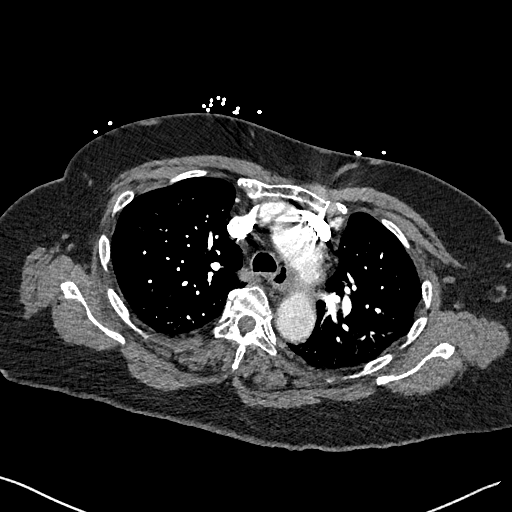
[im 228/322  lung]
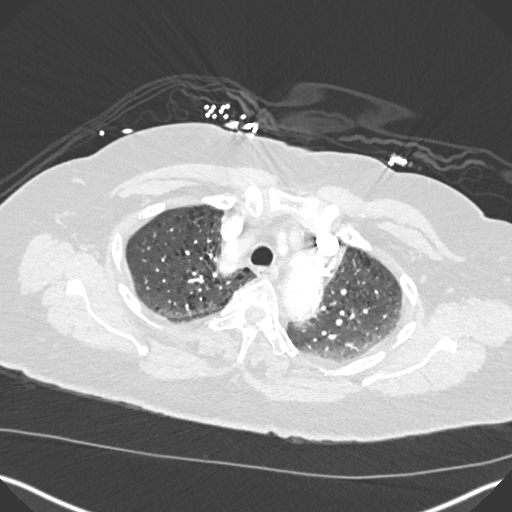
[im 241/322  soft-tissue]
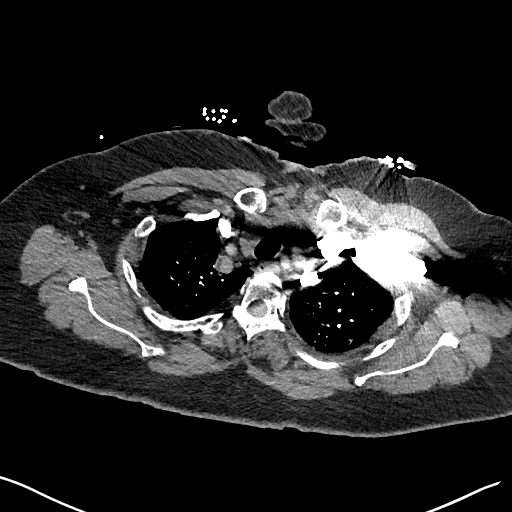
[im 268/322  lung]
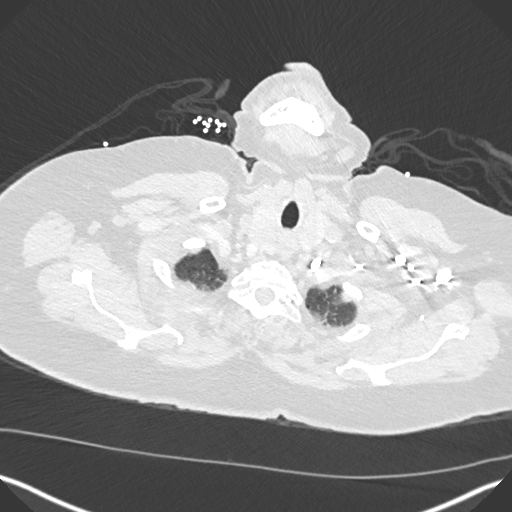
[im 281/322  soft-tissue]
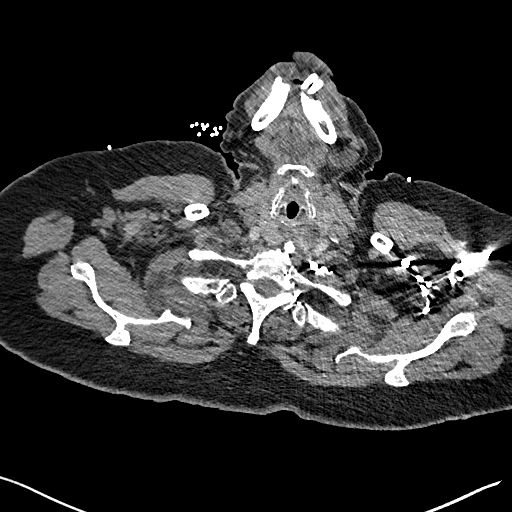
[im 308/322  lung]
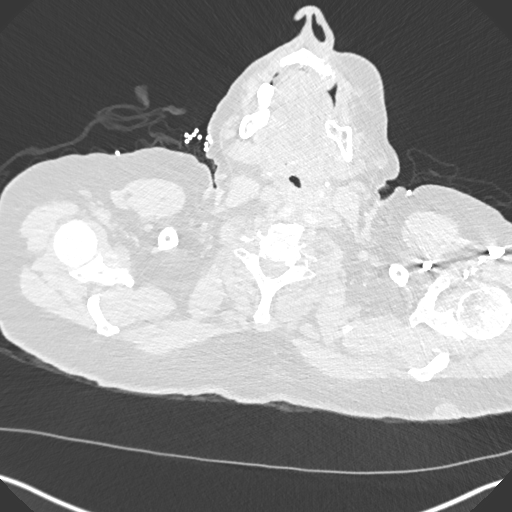

[Series 7: cor soft · coronal · 0.59mm/px · 3 of 151 slices shown]
[im 38/151  soft-tissue]
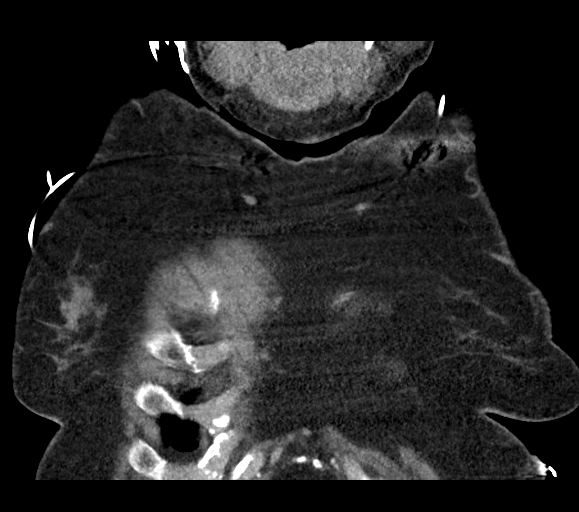
[im 76/151  soft-tissue]
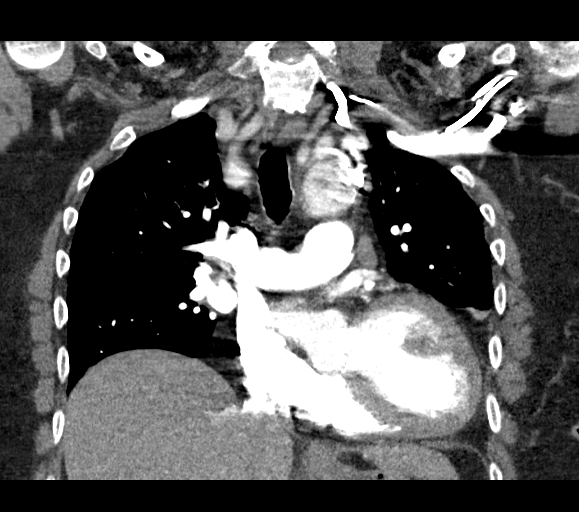
[im 113/151  soft-tissue]
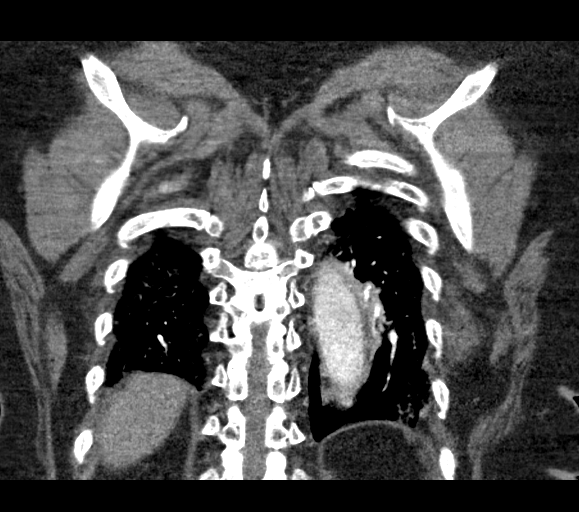

[18 of 46 positions shown; findings below may reference images not displayed]

FINDINGS: Cardiovascular: Good contrast bolus timing in the pulmonary arterial
tree.

No focal filling defect identified in the pulmonary arteries to
suggest acute pulmonary embolism.

Mild to moderate cardiomegaly. Tortuous thoracic aorta with
calcified atherosclerosis. Calcified coronary artery atherosclerosis
is evident on series 5, image 179.

Mediastinum/Nodes: No mediastinal mass or lymphadenopathy.

Lungs/Pleura: Low lung volumes with atelectasis. Major airways
remain patent. Evidence of underlying centrilobular emphysema in the
upper lobes more apparent on the right. No pleural effusion.

Upper Abdomen: Negative visible liver, spleen, stomach.

Musculoskeletal: Disc and endplate degeneration in the visible
cervical spine. No acute osseous abnormality identified.

Review of the MIP images confirms the above findings.
IMPRESSION: 1. Negative for acute pulmonary embolus.
2. Cardiomegaly. Calcified coronary artery and Aortic
Atherosclerosis (CHOLF-AMC.C).
3. Low lung volumes with atelectasis.
